# Patient Record
Sex: Female | Born: 1965 | Race: White | Hispanic: No | Marital: Married | Smoking: Never smoker
Health system: Southern US, Community
[De-identification: ages and names within clinical notes are randomized; demographics above are authoritative.]

## PROBLEM LIST (undated history)

## (undated) DIAGNOSIS — R110 Nausea: Secondary | ICD-10-CM

## (undated) DIAGNOSIS — N200 Calculus of kidney: Secondary | ICD-10-CM

## (undated) DIAGNOSIS — R112 Nausea with vomiting, unspecified: Secondary | ICD-10-CM

## (undated) DIAGNOSIS — I10 Essential (primary) hypertension: Secondary | ICD-10-CM

## (undated) DIAGNOSIS — Z9889 Other specified postprocedural states: Secondary | ICD-10-CM

## (undated) DIAGNOSIS — Z789 Other specified health status: Secondary | ICD-10-CM

---

## 1997-05-31 ENCOUNTER — Inpatient Hospital Stay (HOSPITAL_COMMUNITY): Admission: AD | Admit: 1997-05-31 | Discharge: 1997-06-02 | Payer: Self-pay | Admitting: Obstetrics & Gynecology

## 1997-07-06 ENCOUNTER — Ambulatory Visit (HOSPITAL_COMMUNITY): Admission: RE | Admit: 1997-07-06 | Discharge: 1997-07-06 | Payer: Self-pay | Admitting: Obstetrics and Gynecology

## 1997-12-12 ENCOUNTER — Other Ambulatory Visit: Admission: RE | Admit: 1997-12-12 | Discharge: 1997-12-12 | Payer: Self-pay | Admitting: Obstetrics and Gynecology

## 1998-12-27 ENCOUNTER — Other Ambulatory Visit: Admission: RE | Admit: 1998-12-27 | Discharge: 1998-12-27 | Payer: Self-pay | Admitting: Obstetrics and Gynecology

## 2001-07-28 ENCOUNTER — Other Ambulatory Visit: Admission: RE | Admit: 2001-07-28 | Discharge: 2001-07-28 | Payer: Self-pay | Admitting: Obstetrics and Gynecology

## 2003-06-08 ENCOUNTER — Other Ambulatory Visit: Admission: RE | Admit: 2003-06-08 | Discharge: 2003-06-08 | Payer: Self-pay | Admitting: Obstetrics and Gynecology

## 2004-11-27 ENCOUNTER — Other Ambulatory Visit: Admission: RE | Admit: 2004-11-27 | Discharge: 2004-11-27 | Payer: Self-pay | Admitting: Obstetrics and Gynecology

## 2005-01-16 ENCOUNTER — Encounter (INDEPENDENT_AMBULATORY_CARE_PROVIDER_SITE_OTHER): Payer: Self-pay | Admitting: *Deleted

## 2005-01-16 ENCOUNTER — Ambulatory Visit (HOSPITAL_COMMUNITY): Admission: RE | Admit: 2005-01-16 | Discharge: 2005-01-16 | Payer: Self-pay | Admitting: Obstetrics and Gynecology

## 2007-04-28 HISTORY — PX: ABDOMINAL HYSTERECTOMY: SHX81

## 2007-05-04 ENCOUNTER — Ambulatory Visit (HOSPITAL_COMMUNITY): Admission: RE | Admit: 2007-05-04 | Discharge: 2007-05-06 | Payer: Self-pay | Admitting: Obstetrics and Gynecology

## 2007-05-04 ENCOUNTER — Encounter (INDEPENDENT_AMBULATORY_CARE_PROVIDER_SITE_OTHER): Payer: Self-pay | Admitting: Obstetrics and Gynecology

## 2007-08-19 ENCOUNTER — Emergency Department (HOSPITAL_COMMUNITY): Admission: EM | Admit: 2007-08-19 | Discharge: 2007-08-19 | Payer: Self-pay | Admitting: Emergency Medicine

## 2010-09-09 NOTE — Op Note (Signed)
Sherri Hull, Sherri Hull NO.:  [id number]   MEDICAL RECORD NO.:  [id number]          PATIENT TYPE:  AMB   LOCATION:  SDC                           FACILITY:  [facility]   PHYSICIAN:  Miguel Aschoff, M.D.       DATE OF BIRTH:  [birth year]   DATE OF PROCEDURE:  05/04/2007  DATE OF DISCHARGE:                               OPERATIVE REPORT   PREOPERATIVE DIAGNOSES:  1. Chronic pelvic pain.  2. Pelvic endometriosis.   POSTOPERATIVE DIAGNOSES:  1. Chronic pelvic pain.  2. Pelvic endometriosis.   PROCEDURE:  Laparoscopically-assisted total vaginal hysterectomy and  bilateral salpingo-oophorectomy.   SURGEON:  Dr. Miguel Aschoff.   ANESTHESIA:  General.   COMPLICATIONS:  None.   JUSTIFICATION FOR PROCEDURE:  The patient is a 45 year old white female  with history of persistent pelvic pain.  The patient underwent previous  laparoscopy in September 2006, revealing pelvic endometriosis and small  urinary fibroids.  The endometriosis was treated but her pain has  persisted and this is just associated with heavy menstrual flow.  She  therefore presents now to undergo definitive procedure via  laparoscopically-assisted hysterectomy and bilateral salpingo-  oophorectomy.  The patient understands risks and benefits of the  procedure and understands that this should result in her requiring  hormone replacement therapy since the ovaries are to be removed.  Informed consent has been obtained.   PROCEDURE:  The patient was taken to the operating room, placed in the  supine position.  General anesthesia was administered without  difficulty.  She was then placed in the dorsal lithotomy position,  prepped and draped in the usual sterile fashion.  Foley catheter was  inserted.  At this point, a Hulka tenaculum was placed through the  cervix and held.  Attention was then directed to the umbilicus where a  small infraumbilical incision was made.  A Veress needle was then  inserted and then the  abdomen was insufflated with 3 liters of CO2.  Following this, trocar for laparoscope was placed, followed by  laparoscope itself and under direct visualization two 5 mm ports were  established in the right and left lower quadrants.  After this was done,  systematic inspection was carried out.  This which revealed the uterus  be anterior, globular, top normal in size.  Anterior bladder peritoneum  was unremarkable.  The round ligaments were unremarkable.  The ovaries  were inspected.  The ovaries were noted be within normal limits.  The  right tube was noted be absent due for prior surgical removal.  The left  tube was normal, as was the left ovary.  Cul-de-sac had small implants  of endometriosis noted.  The intestinal surfaces were unremarkable.  No  other abnormalities were noted in abdomen.  At this point, the Gyrus  unit was placed through the right lower quadrant port and the  infundibulopelvic ligament was identified on the right side, cauterized  and cut and then dissection continued along these mesovarian ligament  until the cornual portion of the uterus was reached.  Then the round  ligament  was identified, grasped with the Gyrus unit, cauterized and cut  and then serial bites were taken with the Gyrus unit along the broad  ligament structures until the uterine vessels were reached.  This was  all done using series of cauterizations and cuts.  Once the right side  was completed, the identical procedure was carried out on the left side,  again with care to avoid any injury to the ureters.  The infundibular  pelvic ligament was grasped, cauterized and cut and again dissection  carried medially until cornual portion of the uterus was reached by  cauterizing the mesovarian ligament, mesosalpinx and round ligament and  then serial bites were taken along the broad ligament until the uterine  vessels were reached.  At this point, attention was directed to the  vaginal portion of the  procedure.  The patient was placed in dorsal  lithotomy position.  Weighted speculum was placed in the vaginal vault.  The anterior cervical lip was grasped with a tenaculum and then injected  with 10 mL of 1% Xylocaine with epinephrine.  Then the cervical mucosa  was circumscribed and dissected anteriorly and posteriorly until the  peritoneal reflections were found.  Peritoneum was then entered.  At  this point, the uterosacral ligaments were clamped with curved Heaney  clamps.  These pedicles were cut and suture ligated using suture  ligatures of 0 Vicryl.  These pedicles were held.  Then the cardinal  ligaments were clamped, cut and suture ligated in similar fashion.  Additional bites were then taken of the paracervical fascia on the left  and right sides, again using curved Heaney clamps.  Again, all pedicles  were cut and suture ligated using suture ligatures 0 Vicryl.  Uterine  vessels were then clamped, cut and suture ligated using suture ligatures  of 0 Vicryl.  Once this was done, the uterine fundus was delivered  through the cul-de-sac and residual structures of the broad ligament  were clamped with Heaney clamps, cut and this freed the specimen.  The  specimen included cervix, uterus, tubes and ovaries.  Residual pedicles  were then ligated using ligatures of 0 Vicryl.  Then the posterior  vaginal cuff was made hemostatic by running interlocking 0 Vicryl  suture.  Then the cuff was closed using running interlocking 0 Vicryl  suture.  In doing this, uterosacral ligaments were approximated in the  midline for support of the apex of the cuff.  An Iodoform pack was  placed.  At this point, the patient was taken out of the lithotomy  position.  Attention was directed back to the abdominal portion of the  procedure.  The abdomen was reinsufflated.  Inspection was carried out  for hemostasis.  All pedicles appeared be hemostatically secure.  Pelvis  was then irrigated with saline via  the [place] suction irrigation unit  and with good hemostasis and all instruments and lap counts found to be  correct, the procedure was completed.  The CO2 was allowed to escape.  All instruments were removed.  Small abdominal incisions were closed  using subcuticular 4-0 Vicryl.  The estimated blood loss was  approximately 300 mL.  The patient tolerated the procedure well and went  to the recovery room in satisfactory condition.      Miguel Aschoff, M.D.  Electronically Signed     AR/MEDQ  D:  05/04/2007  T:  05/04/2007  Job:  [id number]

## 2010-09-12 NOTE — Op Note (Signed)
NAMEHANI, PATNODE NO.:  [id number]   MEDICAL RECORD NO.:  [id number]          PATIENT TYPE:  AMB   LOCATION:  SDC                           FACILITY:  [facility]   PHYSICIAN:  Miguel Aschoff, M.D.       DATE OF BIRTH:  [birth year]   DATE OF PROCEDURE:  01/16/2005  DATE OF DISCHARGE:                                 OPERATIVE REPORT   PREOPERATIVE DIAGNOSIS:  Persistent right lower quadrant pain.   POSTOPERATIVE DIAGNOSES:  1.  Peritoneal endometriosis.  2.  Possible intermittent torsion of the distal right tubal segment.   PROCEDURE:  Diagnostic laparoscopy, cautery of the peritoneal endometriosis,  removal of distal right fallopian tube.   SURGEON:  Dr. Miguel Aschoff   ANESTHESIA:  General.   COMPLICATIONS:  None.   JUSTIFICATION:  The patient is a 45 year old white female with a history of  persistent right lower quadrant pain that had not responded to outpatient  therapy.  Prior ultrasound examination was unrevealing and due to the  persistence of her pain, she presents now to undergo laparoscopy to see if  an etiology can be established and corrected. Risks, benefits of procedure  were discussed with the patient.   PROCEDURE:  The patient was taken to the operating, placed in the supine  position. General anesthesia was administered without difficulty. She was  then placed in the dorsolithotomy position, prepped and draped in the usual  sterile fashion. Bladder was catheterized.  A Hulka tenaculum was placed  through the cervix and held. Once this was done, attention was directed to  the umbilicus where a small infraumbilical incision was made. A Veress  needle was inserted and the abdomen was insufflated with 3 liters CO2.  Following this the trocar to laparoscope was placed followed by laparoscope  itself. Then to allow complete visualization a 5-mm suprapubic port was  established under direct visualization. At this point systematic inspection  was carried  out. The anterior bladder peritoneum revealed characteristic  power burn lesions of endometriosis in two areas. The uterus appeared to be  normal size and shape and was unremarkable. The right fallopian tube  revealed prior history of the tubal sterilization with large segment of mid  portion of tube missing. There was what appeared to be small hydrosalpinx  present at the proximal portion of the right remaining tubal segment. The  distal tubal segment appeared to be normal, but on a small stalk and it was  felt that this also may be possibly having intermittent torsion. The ovary  appeared to be normal. The left tube again was normal except for the missing  tubal segment. The left ovary was within normal limits. Inspection of the  uterosacral ligaments was unremarkable. There was no definite evidence of  endometriosis of cul-de-sac. Round ligaments were normal. There was no  evidence of any inguinal hernias. Inspection of the intestinal surfaces was  unremarkable. Inspection of peritoneal surfaces revealed two areas of  endometrial implants on the right anterior peritoneum and lower abdomen. At  this point the bipolar cautery forceps were introduced  and all the implants  of endometriosis on the peritoneal surfaces were identified and cauterized  without difficulty destroying the implants. Care was taken to avoid any  injury to adjacent structures. Then due to the possibility of the patient's  residual pain being secondary to intermittent torsion of the distal right  tube, the distal tube was cauterized along the mesosalpinx and this segment  was cut free and removed through the midline lower abdominal port which had  been enlarged to 11 mm. The proximal segment of tube where there appeared to  be a small hydrosalpinx was cauterized and the hydrosalpinx was eliminated.  At this point with no other abnormalities being noted, the procedure was  completed. The CO2 was allowed to escape. All  instruments were removed. The  lower incision was closed using a 2-0 Vicryl suture and then subcuticular 4-  0 Vicryl suture. Infraumbilical incision was closed using subcuticular 4-0  Vicryl suture. The port sites were injected with 0.25% Marcaine. 10 mL were  used.   Plan is for the patient to return to the office in 4 weeks for follow-up  examination. She is to call for any problems such as fever, pain or heavy  bleeding. She was instructed to do no heavy lifting and to place nothing in  the vagina for approximately 2 weeks.   MEDICATIONS FOR HOME:  1.  Tylox one every 3 hours as needed for pain.  2.  Doxycycline 100 milligrams twice a day x3 days.      Miguel Aschoff, M.D.  Electronically Signed     AR/MEDQ  D:  01/16/2005  T:  01/16/2005  Job:  [id number]

## 2010-09-12 NOTE — Discharge Summary (Signed)
Sherri Hull, Sherri Hull NO.:  [id number]   MEDICAL RECORD NO.:  [id number]          PATIENT TYPE:  OIB   LOCATION:  9304                          FACILITY:  [facility]   PHYSICIAN:  Miguel Aschoff, M.D.       DATE OF BIRTH:  15-May-1965   DATE OF ADMISSION:  05/04/2007  DATE OF DISCHARGE:  05/06/2007                               DISCHARGE SUMMARY   ADMISSION DIAGNOSIS:  Chronic pelvic pain and endometriosis.   FINAL DIAGNOSIS:  Chronic pelvic pain and endometriosis.   OPERATIONS AND PROCEDURES:  Laparoscopically-assisted total vaginal  hysterectomy and bilateral salpingo-oophorectomy.   ANESTHESIA:  General.   BRIEF HISTORY:  The patient is a 45 year old white female with chronic  pelvic pain previously diagnosed with pelvic endometriosis and small  uterine fibroids.  The patient treated conservatively.  However, her  symptomatology has increased with worsening pain.  She presents now to  undergo definitive therapy with laparoscopic hysterectomy and bilateral  salpingo-oophorectomy.   HOSPITAL COURSE:  The patient was admitted and laboratory studies were  obtained.  She had admission hemoglobin of 11.5, white count of 5100.  PT/PTT within normal limits.  Chemistry profile revealed a slightly low  potassium at 3.4.  Glucose was 22.  The remainder of values were normal.  Urinalysis was negative.   Under general anesthesia on May 04, 2007 laparoscopically-assisted  total vaginal hysterectomy and bilateral salpingo-oophorectomy were  carried out without difficulty.  The patient tolerated the surgical  procedure well.  She had an uneventful postoperative course and  tolerated increasing ambulation and diet well.  Her hemoglobin did drop  to 9.1.  She was observed until May 06, 2007 and was in satisfactory  condition and was to be discharged home.  Medications for home included  Tylox 1 every 3 hours for pain, Slow Fe 1 twice a day, estradiol 1 mg  daily.  She is  instructed to place nothing in vagina and to call for any  problems such as fever, pain or heavy bleeding.  She is to set up follow-  up visit for 4 weeks.  The pathology specimen revealed proliferative  endometrium, benign leiomyomas, chronic cervicitis with the ovarian  specimen showing a paratubal cyst, ovarian fluid cysts, and corpus  luteum cyst and pathology was noted.  on weakness [place] day.  Dr. Merrily Brittle, M.D.  Electronically Signed     AR/MEDQ  D:  05/19/2007  T:  05/19/2007  Job:  [id number]

## 2010-10-02 ENCOUNTER — Encounter: Payer: Self-pay | Admitting: Internal Medicine

## 2010-11-13 ENCOUNTER — Ambulatory Visit: Payer: Self-pay | Admitting: Internal Medicine

## 2010-12-01 ENCOUNTER — Other Ambulatory Visit: Payer: Self-pay | Admitting: Gastroenterology

## 2011-01-15 LAB — URINALYSIS, ROUTINE W REFLEX MICROSCOPIC
Bilirubin Urine: NEGATIVE
Nitrite: NEGATIVE
Protein, ur: NEGATIVE
Specific Gravity, Urine: 1.01
Urobilinogen, UA: 0.2

## 2011-01-15 LAB — COMPREHENSIVE METABOLIC PANEL WITH GFR
ALT: 18
AST: 19
Albumin: 4.1
Alkaline Phosphatase: 64
BUN: 8
CO2: 28
Calcium: 9.1
Chloride: 105
Creatinine, Ser: 0.77
GFR calc non Af Amer: >60
Glucose, Bld: 118 — ABNORMAL HIGH
Potassium: 3.4 — ABNORMAL LOW
Sodium: 140
Total Bilirubin: 0.6
Total Protein: 7.4

## 2011-01-15 LAB — HEMOGLOBIN AND HEMATOCRIT, BLOOD: HCT: 30.4 — ABNORMAL LOW

## 2011-01-15 LAB — CBC
HCT: 26.2 — ABNORMAL LOW
HCT: 33.6 — ABNORMAL LOW
MCHC: 34.1
MCHC: 34.6
MCV: 84.1
MCV: 84.8
Platelets: 184
Platelets: 235
RDW: 13.8

## 2011-01-15 LAB — PROTIME-INR
INR: 1
Prothrombin Time: 13.3

## 2011-01-15 LAB — PREGNANCY, URINE: Preg Test, Ur: NEGATIVE

## 2011-01-15 LAB — DIFFERENTIAL
Basophils Absolute: 0
Basophils Relative: 1
Eosinophils Relative: 2
Lymphocytes Relative: 38
Neutro Abs: 2.7

## 2011-07-06 ENCOUNTER — Other Ambulatory Visit: Payer: Self-pay | Admitting: Gastroenterology

## 2011-07-06 DIAGNOSIS — R1011 Right upper quadrant pain: Secondary | ICD-10-CM

## 2011-07-09 ENCOUNTER — Ambulatory Visit
Admission: RE | Admit: 2011-07-09 | Discharge: 2011-07-09 | Disposition: A | Discharge status: Home / Self Care | Payer: 59 | Source: Ambulatory Visit | Attending: Gastroenterology | Admitting: Gastroenterology

## 2011-07-09 ENCOUNTER — Other Ambulatory Visit (HOSPITAL_COMMUNITY): Payer: Self-pay | Admitting: Gastroenterology

## 2011-07-09 DIAGNOSIS — R1011 Right upper quadrant pain: Secondary | ICD-10-CM

## 2011-07-20 ENCOUNTER — Encounter (HOSPITAL_COMMUNITY)
Admission: RE | Admit: 2011-07-20 | Discharge: 2011-07-20 | Disposition: A | Discharge status: Home / Self Care | Payer: 59 | Source: Ambulatory Visit | Attending: Gastroenterology | Admitting: Gastroenterology

## 2011-07-20 DIAGNOSIS — R1011 Right upper quadrant pain: Secondary | ICD-10-CM | POA: Insufficient documentation

## 2011-07-20 DIAGNOSIS — R948 Abnormal results of function studies of other organs and systems: Secondary | ICD-10-CM | POA: Insufficient documentation

## 2011-07-20 MED ORDER — TECHNETIUM TC 99M MEBROFENIN IV KIT
5.0000 | PACK | Freq: Once | INTRAVENOUS | Status: AC | PRN
  Administered 2011-07-20: 5 via INTRAVENOUS

## 2011-07-20 MED ORDER — SINCALIDE 5 MCG IJ SOLR
INTRAMUSCULAR | Status: AC
  Administered 2011-07-20: 5 ug via INTRAVENOUS
  Filled 2011-07-20: qty 5

## 2011-07-23 ENCOUNTER — Encounter (INDEPENDENT_AMBULATORY_CARE_PROVIDER_SITE_OTHER): Payer: Self-pay | Admitting: General Surgery

## 2011-07-23 ENCOUNTER — Ambulatory Visit (INDEPENDENT_AMBULATORY_CARE_PROVIDER_SITE_OTHER): Payer: 59 | Admitting: General Surgery

## 2011-07-23 VITALS — BP 134/86 | HR 60 | Temp 97.7°F | Ht 67.0 in | Wt 241.0 lb

## 2011-07-23 DIAGNOSIS — K828 Other specified diseases of gallbladder: Secondary | ICD-10-CM

## 2011-07-23 NOTE — Progress Notes (Signed)
Patient ID: Sherri Hull, female   DOB: 10-04-1965, 46 y.o.   MRN: [id number]  Chief Complaint  Patient presents with  . Abdominal Pain    HPI Sherri Hull is a 46 y.o. female.  Referred by Dr. Evette Cristal for eval of RUQ abdominal pain after eating.  Pain radiates to her back and has associated nausea but only rare/occasional vomiting.  She has had the pain for several months but says that it has been more frequent and her last episode was last Saturday after eating spaghetti.  She says that the pain usually comes first and leads to the nausea.  She also has some diarrhea with fatty foods but denies any other symptoms such as fevers or chills.  She had an [place] which was negative for cholelithiasis and this was followed by a [place] which showed a 12% EF and she was referred for surgical consultation. LFT's were normal. HPI  No past medical history on file.  Past Surgical History  Procedure Date  . Abdominal hysterectomy 2009    No family history on file.  Social History History  Substance Use Topics  . Smoking status: Never Smoker   . Smokeless tobacco: Not on file  . Alcohol Use: No    No Known Allergies  No current outpatient prescriptions on file.    Review of Systems Review of Systems All other review of systems negative or noncontributory except as stated in the HPI  Blood pressure 134/86, pulse 60, temperature 97.7 F (36.5 C), height 5\' 7"  (1.702 m), weight 241 lb (109.317 kg).  Physical Exam Physical Exam Physical Exam  Nursing note and vitals reviewed. Constitutional: She is oriented to person, place, and time. She appears well-developed and well-nourished. No distress.  HENT:  Head: Normocephalic and atraumatic.  Mouth/Throat: No oropharyngeal exudate.  Eyes: Conjunctivae and EOM are normal. Pupils are equal, round, and reactive to light. Right eye exhibits no discharge. Left eye exhibits no discharge. No scleral icterus.  Neck: Normal range of motion. Neck supple. No  tracheal deviation present.  Cardiovascular: Normal rate, regular rhythm, normal heart sounds and intact distal pulses.   Pulmonary/Chest: Effort normal and breath sounds normal. No stridor. No respiratory distress. She has no wheezes.  Abdominal: Soft. Bowel sounds are normal. She exhibits no distension and no mass. There is no tenderness. There is no rebound and no guarding.  Musculoskeletal: Normal range of motion. She exhibits no edema and no tenderness.  Neurological: She is alert and oriented to person, place, and time.  Skin: Skin is warm and dry. No rash noted. She is not diaphoretic. No erythema. No pallor.  Psychiatric: She has a normal mood and affect. Her behavior is normal. Judgment and thought content normal.    Data Reviewed [place], HIDA, Labs  Assessment    Biliary dyskinesia She has symptoms which do sound biliary in nature and with a HIDA of 12% I think that it is reasonable to offer cholecystectomy.  I did explain that her biggest risks would be persistent symptoms/failure of relief and diarrhea.  We also discussed the other risks of the procedure including infection, bleeding, pain, scarring, need for open, injury to bowel or bile ducts, and diarrhea since she is already having symptoms of this. She expressed understanding and desires to proceed with lap cholecystectomy.    Plan    Will schedule as soon as available.       Lodema Pilot DAVID 07/23/2011, 8:08 PM

## 2011-08-06 ENCOUNTER — Encounter (HOSPITAL_COMMUNITY): Payer: Self-pay | Admitting: Pharmacy Technician

## 2011-08-13 ENCOUNTER — Encounter (HOSPITAL_COMMUNITY)
Admission: RE | Admit: 2011-08-13 | Discharge: 2011-08-13 | Disposition: A | Discharge status: Home / Self Care | Payer: 59 | Source: Ambulatory Visit | Attending: General Surgery | Admitting: General Surgery

## 2011-08-13 ENCOUNTER — Encounter (HOSPITAL_COMMUNITY): Payer: Self-pay

## 2011-08-13 VITALS — BP 124/77 | HR 64 | Temp 97.0°F | Resp 20 | Ht 67.0 in | Wt 241.4 lb

## 2011-08-13 DIAGNOSIS — K828 Other specified diseases of gallbladder: Secondary | ICD-10-CM

## 2011-08-13 HISTORY — DX: Other specified health status: Z78.9

## 2011-08-13 HISTORY — DX: Other specified postprocedural states: Z98.890

## 2011-08-13 HISTORY — DX: Nausea with vomiting, unspecified: R11.2

## 2011-08-13 HISTORY — DX: Nausea: R11.0

## 2011-08-13 LAB — CBC
MCH: 29.3 pg (ref 26.0–34.0)
MCHC: 33.7 g/dL (ref 30.0–36.0)
MCV: 86.9 fL (ref 78.0–100.0)
Platelets: 191 10*3/uL (ref 150–400)
RDW: 12.5 % (ref 11.5–15.5)
WBC: 6.1 10*3/uL (ref 4.0–10.5)

## 2011-08-13 LAB — DIFFERENTIAL
Basophils Absolute: 0 10*3/uL (ref 0.0–0.1)
Lymphocytes Relative: 47 % — ABNORMAL HIGH (ref 12–46)
Monocytes Absolute: 0.5 10*3/uL (ref 0.1–1.0)
Monocytes Relative: 8 % (ref 3–12)
Neutro Abs: 2.6 10*3/uL (ref 1.7–7.7)
Neutrophils Relative %: 43 % (ref 43–77)

## 2011-08-13 LAB — COMPREHENSIVE METABOLIC PANEL
AST: 20 U/L (ref 0–37)
Albumin: 4.1 g/dL (ref 3.5–5.2)
BUN: 18 mg/dL (ref 6–23)
Calcium: 9.3 mg/dL (ref 8.4–10.5)
Creatinine, Ser: 0.76 mg/dL (ref 0.50–1.10)

## 2011-08-13 NOTE — Pre-Procedure Instructions (Signed)
20 Sherri Hull  08/13/2011   Your procedure is scheduled on:  Thursday, April 25th   Report to [facility] at 10:00 AM.   Call this number if you have problems the morning of surgery: [phone]   Remember:   Do not eat food:After Midnight Wednesday .  May have clear liquids: up to 4 Hours before arrival time...6 AM  Clear liquids include soda, tea, black coffee, apple or grape juice, broth .  Take these medicines the morning of surgery with A SIP OF WATER: none   Do not wear jewelry, make-up or nail polish.  Do not wear lotions, powders, or perfumes. You may wear deodorant.  Do not shave 48 hours prior to surgery.  Do not bring valuables to the hospital.  Contacts, dentures or bridgework may not be worn into surgery.  Leave suitcase in the car. After surgery it may be brought to your room.  For patients admitted to the hospital, checkout time is 11:00 AM the day of discharge.   Patients discharged the day of surgery will not be allowed to drive home.  Name and phone number of your driver: family   Special Instructions: CHG Shower Use Special Wash: 1/2 bottle night before surgery and 1/2 bottle morning of surgery.   Please read over the following fact sheets that you were given: Pain Booklet, Coughing and Deep Breathing, MRSA Information and Surgical Site Infection Prevention

## 2011-08-19 MED ORDER — CEFAZOLIN SODIUM-DEXTROSE 2-3 GM-% IV SOLR
2.0000 g | INTRAVENOUS | Status: AC
  Administered 2011-08-20: 2 g via INTRAVENOUS
  Filled 2011-08-19: qty 50

## 2011-08-20 ENCOUNTER — Encounter (HOSPITAL_COMMUNITY): Payer: Self-pay | Admitting: Anesthesiology

## 2011-08-20 ENCOUNTER — Encounter (HOSPITAL_COMMUNITY)
Admission: RE | Disposition: A | Discharge status: Home / Self Care | Payer: Self-pay | Source: Ambulatory Visit | Attending: General Surgery

## 2011-08-20 ENCOUNTER — Ambulatory Visit (HOSPITAL_COMMUNITY): Payer: 59

## 2011-08-20 ENCOUNTER — Ambulatory Visit (HOSPITAL_COMMUNITY): Payer: 59 | Admitting: Anesthesiology

## 2011-08-20 ENCOUNTER — Encounter (HOSPITAL_COMMUNITY): Payer: Self-pay | Admitting: *Deleted

## 2011-08-20 ENCOUNTER — Ambulatory Visit (HOSPITAL_COMMUNITY)
Admission: RE | Admit: 2011-08-20 | Discharge: 2011-08-20 | Disposition: A | Discharge status: Home / Self Care | Payer: 59 | Source: Ambulatory Visit | Attending: General Surgery | Admitting: General Surgery

## 2011-08-20 DIAGNOSIS — Z01812 Encounter for preprocedural laboratory examination: Secondary | ICD-10-CM | POA: Insufficient documentation

## 2011-08-20 DIAGNOSIS — K219 Gastro-esophageal reflux disease without esophagitis: Secondary | ICD-10-CM | POA: Insufficient documentation

## 2011-08-20 DIAGNOSIS — K811 Chronic cholecystitis: Secondary | ICD-10-CM

## 2011-08-20 DIAGNOSIS — K828 Other specified diseases of gallbladder: Secondary | ICD-10-CM | POA: Insufficient documentation

## 2011-08-20 HISTORY — PX: CHOLECYSTECTOMY: SHX55

## 2011-08-20 SURGERY — LAPAROSCOPIC CHOLECYSTECTOMY WITH INTRAOPERATIVE CHOLANGIOGRAM
Anesthesia: General | Site: Abdomen | Wound class: Clean Contaminated

## 2011-08-20 MED ORDER — LIDOCAINE-EPINEPHRINE (PF) 1 %-1:200000 IJ SOLN
INTRAMUSCULAR | Status: DC | PRN
  Administered 2011-08-20: 30 mL

## 2011-08-20 MED ORDER — ONDANSETRON HCL 4 MG/2ML IJ SOLN
INTRAMUSCULAR | Status: DC | PRN
  Administered 2011-08-20 (×2): 4 mg via INTRAVENOUS

## 2011-08-20 MED ORDER — MIDAZOLAM HCL 5 MG/5ML IJ SOLN
INTRAMUSCULAR | Status: DC | PRN
  Administered 2011-08-20: 2 mg via INTRAVENOUS

## 2011-08-20 MED ORDER — HYDROMORPHONE HCL PF 1 MG/ML IJ SOLN
0.2500 mg | INTRAMUSCULAR | Status: DC | PRN
  Administered 2011-08-20 (×2): 0.5 mg via INTRAVENOUS

## 2011-08-20 MED ORDER — FENTANYL CITRATE 0.05 MG/ML IJ SOLN
INTRAMUSCULAR | Status: DC | PRN
  Administered 2011-08-20: 150 ug via INTRAVENOUS
  Administered 2011-08-20 (×2): 50 ug via INTRAVENOUS

## 2011-08-20 MED ORDER — KETOROLAC TROMETHAMINE 30 MG/ML IJ SOLN
30.0000 mg | Freq: Once | INTRAMUSCULAR | Status: AC
  Administered 2011-08-20: 30 mg via INTRAVENOUS

## 2011-08-20 MED ORDER — NEOSTIGMINE METHYLSULFATE 1 MG/ML IJ SOLN
INTRAMUSCULAR | Status: DC | PRN
  Administered 2011-08-20: 4 mg via INTRAVENOUS

## 2011-08-20 MED ORDER — SODIUM CHLORIDE 0.9 % IR SOLN
Status: DC | PRN
  Administered 2011-08-20: 1000 mL

## 2011-08-20 MED ORDER — ACETAMINOPHEN 10 MG/ML IV SOLN
1000.0000 mg | Freq: Once | INTRAVENOUS | Status: AC
Start: 2011-08-20 — End: 2011-08-20
  Administered 2011-08-20: 1000 mg via INTRAVENOUS
  Filled 2011-08-20: qty 100

## 2011-08-20 MED ORDER — GLYCOPYRROLATE 0.2 MG/ML IJ SOLN
INTRAMUSCULAR | Status: DC | PRN
  Administered 2011-08-20: 0.6 mg via INTRAVENOUS

## 2011-08-20 MED ORDER — KETOROLAC TROMETHAMINE 30 MG/ML IJ SOLN
INTRAMUSCULAR | Status: AC
  Filled 2011-08-20: qty 1

## 2011-08-20 MED ORDER — LACTATED RINGERS IV SOLN
INTRAVENOUS | Status: DC | PRN
  Administered 2011-08-20 (×2): via INTRAVENOUS

## 2011-08-20 MED ORDER — LIDOCAINE HCL (CARDIAC) 20 MG/ML IV SOLN
INTRAVENOUS | Status: DC | PRN
  Administered 2011-08-20: 100 mg via INTRAVENOUS

## 2011-08-20 MED ORDER — PROMETHAZINE HCL 25 MG/ML IJ SOLN
INTRAMUSCULAR | Status: AC
  Filled 2011-08-20: qty 1

## 2011-08-20 MED ORDER — ROCURONIUM BROMIDE 100 MG/10ML IV SOLN
INTRAVENOUS | Status: DC | PRN
  Administered 2011-08-20: 10 mg via INTRAVENOUS
  Administered 2011-08-20: 40 mg via INTRAVENOUS

## 2011-08-20 MED ORDER — BUPIVACAINE HCL (PF) 0.25 % IJ SOLN
INTRAMUSCULAR | Status: DC | PRN
  Administered 2011-08-20: 30 mL

## 2011-08-20 MED ORDER — 0.9 % SODIUM CHLORIDE (POUR BTL) OPTIME
TOPICAL | Status: DC | PRN
  Administered 2011-08-20: 1000 mL

## 2011-08-20 MED ORDER — PROPOFOL 10 MG/ML IV EMUL
INTRAVENOUS | Status: DC | PRN
  Administered 2011-08-20: 200 mg via INTRAVENOUS

## 2011-08-20 MED ORDER — PROMETHAZINE HCL 25 MG/ML IJ SOLN
6.2500 mg | INTRAMUSCULAR | Status: DC | PRN
  Administered 2011-08-20: 6.25 mg via INTRAVENOUS

## 2011-08-20 MED ORDER — SCOPOLAMINE 1 MG/3DAYS TD PT72: 1.0000 | MEDICATED_PATCH | TRANSDERMAL | Status: DC

## 2011-08-20 MED ORDER — IOHEXOL 300 MG/ML  SOLN
INTRAMUSCULAR | Status: DC | PRN
  Administered 2011-08-20: 12:00:00

## 2011-08-20 MED ORDER — HYDROCODONE-ACETAMINOPHEN 5-325 MG PO TABS: 1.0000 | ORAL_TABLET | ORAL | Status: AC | PRN

## 2011-08-20 MED ORDER — DEXAMETHASONE SODIUM PHOSPHATE 4 MG/ML IJ SOLN
INTRAMUSCULAR | Status: DC | PRN
  Administered 2011-08-20: 8 mg via INTRAVENOUS

## 2011-08-20 MED ORDER — SCOPOLAMINE 1 MG/3DAYS TD PT72
MEDICATED_PATCH | TRANSDERMAL | Status: DC | PRN
  Administered 2011-08-20: 1 via TRANSDERMAL

## 2011-08-20 SURGICAL SUPPLY — 54 items
ADH SKN CLS APL DERMABOND .7 (GAUZE/BANDAGES/DRESSINGS) ×1
ADH SKN CLS LQ APL DERMABOND (GAUZE/BANDAGES/DRESSINGS) ×1
APPLIER CLIP ROT 10 11.4 M/L (STAPLE) ×2
APR CLP MED LRG 11.4X10 (STAPLE) ×1
BAG SPEC RTRVL LRG 6X4 10 (ENDOMECHANICALS) ×1
BLADE SURG ROTATE 9660 (MISCELLANEOUS) IMPLANT
CANISTER SUCTION 2500CC (MISCELLANEOUS) ×2 IMPLANT
CATH REDDICK CHOLANGI 4FR 50CM (CATHETERS) ×1 IMPLANT
CHLORAPREP W/TINT 26ML (MISCELLANEOUS) ×2 IMPLANT
CLIP APPLIE ROT 10 11.4 M/L (STAPLE) ×1 IMPLANT
CLOTH BEACON ORANGE TIMEOUT ST (SAFETY) ×2 IMPLANT
COVER SURGICAL LIGHT HANDLE (MISCELLANEOUS) ×2 IMPLANT
DECANTER SPIKE VIAL GLASS SM (MISCELLANEOUS) ×2 IMPLANT
DERMABOND ADHESIVE PROPEN (GAUZE/BANDAGES/DRESSINGS) ×1
DERMABOND ADVANCED (GAUZE/BANDAGES/DRESSINGS) ×1
DERMABOND ADVANCED .7 DNX12 (GAUZE/BANDAGES/DRESSINGS) ×1 IMPLANT
DERMABOND ADVANCED .7 DNX6 (GAUZE/BANDAGES/DRESSINGS) IMPLANT
DRAPE C-ARM 42X72 X-RAY (DRAPES) ×2 IMPLANT
ELECT CAUTERY BLADE 6.4 (BLADE) ×2 IMPLANT
ELECT REM PT RETURN 9FT ADLT (ELECTROSURGICAL) ×2
ELECTRODE REM PT RTRN 9FT ADLT (ELECTROSURGICAL) ×1 IMPLANT
ENDOLOOP SUT PDS II  0 18 (SUTURE) ×1
ENDOLOOP SUT PDS II 0 18 (SUTURE) IMPLANT
GLOVE BIO SURGEON STRL SZ7.5 (GLOVE) ×1 IMPLANT
GLOVE BIOGEL PI IND STRL 7.0 (GLOVE) IMPLANT
GLOVE BIOGEL PI IND STRL 7.5 (GLOVE) IMPLANT
GLOVE BIOGEL PI INDICATOR 7.0 (GLOVE) ×1
GLOVE BIOGEL PI INDICATOR 7.5 (GLOVE) ×1
GLOVE ECLIPSE 6.5 STRL STRAW (GLOVE) ×1 IMPLANT
GLOVE EXAM NITRILE MD LF STRL (GLOVE) ×1 IMPLANT
GLOVE SURG SS PI 7.5 STRL IVOR (GLOVE) ×4 IMPLANT
GOWN PREVENTION PLUS XLARGE (GOWN DISPOSABLE) ×2 IMPLANT
GOWN STRL NON-REIN LRG LVL3 (GOWN DISPOSABLE) ×6 IMPLANT
IV CATH 14GX2 1/4 (CATHETERS) ×2 IMPLANT
KIT BASIN OR (CUSTOM PROCEDURE TRAY) ×2 IMPLANT
KIT ROOM TURNOVER OR (KITS) ×2 IMPLANT
NS IRRIG 1000ML POUR BTL (IV SOLUTION) ×2 IMPLANT
PAD ARMBOARD 7.5X6 YLW CONV (MISCELLANEOUS) ×2 IMPLANT
PENCIL BUTTON HOLSTER BLD 10FT (ELECTRODE) ×2 IMPLANT
POUCH SPECIMEN RETRIEVAL 10MM (ENDOMECHANICALS) ×2 IMPLANT
SCISSORS LAP 5X35 DISP (ENDOMECHANICALS) IMPLANT
SET IRRIG TUBING LAPAROSCOPIC (IRRIGATION / IRRIGATOR) ×2 IMPLANT
SLEEVE ENDOPATH XCEL 5M (ENDOMECHANICALS) ×2 IMPLANT
SPECIMEN JAR MEDIUM (MISCELLANEOUS) ×1 IMPLANT
SPECIMEN JAR SMALL (MISCELLANEOUS) ×1 IMPLANT
SUT MNCRL AB 4-0 PS2 18 (SUTURE) ×4 IMPLANT
SUT VICRYL 0 UR6 27IN ABS (SUTURE) ×2 IMPLANT
TOWEL OR 17X24 6PK STRL BLUE (TOWEL DISPOSABLE) ×2 IMPLANT
TOWEL OR 17X26 10 PK STRL BLUE (TOWEL DISPOSABLE) ×2 IMPLANT
TRAY FOLEY CATH 14FR (SET/KITS/TRAYS/PACK) IMPLANT
TRAY LAPAROSCOPIC (CUSTOM PROCEDURE TRAY) ×2 IMPLANT
TROCAR HASSON GELL 12X100 (TROCAR) ×2 IMPLANT
TROCAR XCEL NON-BLD 11X100MML (ENDOMECHANICALS) ×2 IMPLANT
TROCAR XCEL NON-BLD 5MMX100MML (ENDOMECHANICALS) ×2 IMPLANT

## 2011-08-20 NOTE — Brief Op Note (Signed)
08/20/2011  1:02 PM  PATIENT:  Ellis Parents  46 y.o. female  PRE-OPERATIVE DIAGNOSIS:  BILIARY DYSKINESIA  POST-OPERATIVE DIAGNOSIS:  BILIARY DYSKINESIA  PROCEDURE:  Procedure(s) (LRB): LAPAROSCOPIC CHOLECYSTECTOMY WITH INTRAOPERATIVE CHOLANGIOGRAM (N/A)  SURGEON:  Surgeon(s) and Role:    * Lodema Pilot, DO - Primary  PHYSICIAN ASSISTANT:   ASSISTANTS: none   ANESTHESIA:   general  EBL:  Total I/O In: 1000 [I.V.:1000] Out: -   BLOOD ADMINISTERED:none  DRAINS: none   LOCAL MEDICATIONS USED:  MARCAINE    and LIDOCAINE   SPECIMEN:  Source of Specimen:  gallbladder  DISPOSITION OF SPECIMEN:  PATHOLOGY  COUNTS:  YES  TOURNIQUET:  * No tourniquets in log *  DICTATION: .Other Dictation: Dictation Number   PLAN OF CARE: Discharge to home after PACU  PATIENT DISPOSITION:  PACU - hemodynamically stable.   Delay start of Pharmacological VTE agent (>24hrs) due to surgical blood loss or risk of bleeding: no

## 2011-08-20 NOTE — Discharge Instructions (Signed)
Laparoscopic Cholecystectomy Care After These instructions give you information on caring for yourself after your procedure. Your doctor may also give you more specific instructions. Call your doctor if you have any problems or questions after your procedure. HOME CARE  Change your bandages (dressings) as told by your doctor.   Keep the wound dry and clean. Wash the wound gently with soap and water. Pat the wound dry with a clean towel.   Do not take baths, swim, or use hot tubs for 10 days, or as told by your doctor.   Only take medicine as told by your doctor.   Eat a normal diet as told by your doctor.   Do not lift anything heavier than 25 pounds (11.5 kg), or as told by your doctor.   Do not play contact sports for 1 week, or as told by your doctor.  GET HELP RIGHT AWAY IF:   Your wound is red, puffy (swollen), or painful.   You have yellowish-white fluid (pus) coming from the wound.   You have fluid draining from the wound for more than 1 day.   You have a bad smell coming from the wound.   Your wound breaks open.   You have a rash.   You have trouble breathing.   You have chest pain.   You have a bad reaction to your medicine.   You have a fever.   You have pain in the shoulders (shoulder strap areas).   You feel dizzy or pass out (faint).   You have severe belly (abdominal) pain.   You feel sick to your stomach (nauseous) or throw up (vomit) for more than 1 day.  MAKE SURE YOU:  Understand these instructions.   Will watch your condition.   Will get help right away if you are not doing well or get worse.  Document Released: 01/21/2008 Document Revised: 04/02/2011 Document Reviewed: 09/30/2010 [employer] Patient Information 2012 [employer]. 

## 2011-08-20 NOTE — Transfer of Care (Signed)
Immediate Anesthesia Transfer of Care Note  Patient: Sherri Hull  Procedure(s) Performed: Procedure(s) (LRB): LAPAROSCOPIC CHOLECYSTECTOMY WITH INTRAOPERATIVE CHOLANGIOGRAM (N/A)  Patient Location: PACU  Anesthesia Type: General  Level of Consciousness: awake, alert  and oriented  Airway & Oxygen Therapy: Patient Spontanous Breathing and Patient connected to face mask oxygen  Post-op Assessment: Report given to PACU RN, Post -op Vital signs reviewed and stable and Patient moving all extremities X 4  Post vital signs: Reviewed and stable  Complications: No apparent anesthesia complications

## 2011-08-20 NOTE — Op Note (Signed)
Sherri Hull, Sherri Hull NO.:  [id number]  MEDICAL RECORD NO.:  [id number]  LOCATION:  [facility]                         FACILITY:  [facility]  PHYSICIAN:  Lodema Pilot, MD       DATE OF BIRTH:  February 05, 1966  DATE OF PROCEDURE:  08/20/2011 DATE OF DISCHARGE:                              OPERATIVE REPORT   PROCEDURE:  Laparoscopic cholecystectomy with intraoperative cholangiogram.  PREOPERATIVE DIAGNOSIS:  Biliary dyskinesia.  POSTOP DIAGNOSIS:  Biliary dyskinesia.  SURGEON:  Lodema Pilot, MD  ASSISTANT:  None.  ANESTHESIA:  General endotracheal anesthesia with 26 mL of 1% lidocaine with epinephrine and 0.25% Marcaine plain.  FLUIDS:  1100 mL crystalloid.  ESTIMATED BLOOD LOSS:  Minimal.  DRAINS:  None.  SPECIMENS:  Gallbladder and contents sent to pathology for permanent section.  INDICATION FOR PROCEDURE:  Sherri Hull has postprandial abdominal pain and has seen gastroenterologist for evaluation of this.  She had ultrasound which was negative for gallstones, but she did have a HIDA scan, which demonstrated some biliary dyskinesia with ejection fraction of 12 to 13%.  OPERATIVE DETAILS:  Sherri Hull was seen and evaluated in the preoperative area and risks and benefits of procedure were again discussed in lay terms.  Informed consent was obtained, again discussed with her the risks and possibility of persistent symptoms after her procedure specially given the absence of gallstones, and she expressed understanding and desired to proceed with cholecystectomy.  Prophylactic antibiotics were given, and she was taken to operating room, placed on table in supine position, and general endotracheal anesthesia was obtained.  Her abdomen was prepped and draped in the standard surgical fashion, and a supraumbilical midline incision was made in the skin and dissection carried down through subcutaneous tissue using blunt dissection.  The abdominal wall fascia was elevated and  sharply incised, and the peritoneum entered bluntly.  The 12-mm balloon port was placed into the abdomen, and the pneumoperitoneum was obtained.  There was no evidence of bowel injury upon entry, and two 5 mm right upper quadrant trocars and one 11 mm epigastric trocar was replaced under direct visualization, and the gallbladder was retracted cephalad.  There was some fatty adhesions around the cystic duct, but the peritoneum and fat was taken down using blunt dissection, the cystic artery was identified in its usual position on the medial aspect of the gallbladder coursing up onto the gallbladder, and this was skeletonized and clipped between hemoclips with 2 clips on the stay side.  Then, the cystic duct was skeletonized high on the gallbladder and a critical view of safety was obtained visualizing a single cystic duct entering gallbladder and visualizing liver parenchyma through the triangle of Calot.  A clip was placed on the gallbladder side of the cystic duct and the cholangiogram catheter was passed into the abdominal wall using a 14-gauge angiocatheter.  A small cystic ductotomy was made, and a cholangiogram catheter was passed into the cystic duct and clipped into place. Cholangiogram was performed which demonstrated a long cystic duct, normal right and left hepatic ducts, normal filling of the common bile duct, without any evidence of any filling defects and free flow of bile  into the duodenum.  Cholangiogram catheter was removed, and clip was placed on the cystic duct stump and 0 PDS endo-loop was also placed around the cystic duct stump.  Then, the gallbladder was removed from the gallbladder fossa using Bovie electrocautery, and the gallbladder was not entered during the dissection, it was completely removed and placed the EndoCatch bag and removed from the umbilical trocar site in an EndoCatch bag.  I did not palpate any gallstones, and this was sent to pathology for  permanent section.  The abdomen was explored and the right upper quadrant was irrigated with sterile saline solution.  The irrigation returned clear.  There was no evidence of bleeding or bowel injury identified.  Clips and the loops appeared to be in good position. The gallbladder fossa was hemostatic.  The remainder of the right upper quadrant ports were removed under direct visualization, and the umbilical fascia was approximated with interrupted 0 Vicryl sutures in open fashion.  Sutures were secured and the abdomen reinsufflated with carbon dioxide gas.  The closure was noted be adequate without any evidence of bowel injury.  The final trocar was removed, and the skin and wounds were injected with a total of 26 mL of 1% lidocaine with epinephrine and 0.25% of Marcaine in a 50:50 mixture.  Skin edges were approximated with 4-0 Monocryl subcuticular suture.  Skin was washed and dried and Dermabond was applied.  All sponge, needle, instrument counts were correct in the case.  The patient tolerated the procedure well without apparent complications.          ______________________________ Lodema Pilot, MD     BL/MEDQ  D:  08/20/2011  T:  08/20/2011  Job:  [id number]

## 2011-08-20 NOTE — Progress Notes (Signed)
DRANK COKE AND ATE CRACKERS; NO C/O NAUSEA OR PAIN; UP TO BR AND VOIDED AND TOL WELL

## 2011-08-20 NOTE — Progress Notes (Signed)
Report to Stephanie RN as caregiver 

## 2011-08-20 NOTE — Anesthesia Preprocedure Evaluation (Signed)
Anesthesia Evaluation  Patient identified by MRN, date of birth, ID band Patient awake    Reviewed: H&P , NPO status , Patient's Chart, lab work & pertinent test results  History of Anesthesia Complications (+) PONV  Airway Mallampati: II TM Distance: >3 FB Neck ROM: Full    Dental  (+) Dental Advisory Given, Teeth Intact and Chipped   Pulmonary neg pulmonary ROS,  breath sounds clear to auscultation  Pulmonary exam normal       Cardiovascular negative cardio ROS  Rhythm:Regular Rate:Normal     Neuro/Psych negative neurological ROS     GI/Hepatic GERD-  Controlled,N/V with gallbladder   Endo/Other  Morbid obesity  Renal/GU negative Renal ROS     Musculoskeletal   Abdominal (+) + obese,   Peds  Hematology negative hematology ROS (+)   Anesthesia Other Findings   Reproductive/Obstetrics                           Anesthesia Physical Anesthesia Plan  ASA: II  Anesthesia Plan: General   Post-op Pain Management:    Induction: Intravenous and Rapid sequence  Airway Management Planned: Oral ETT  Additional Equipment:   Intra-op Plan:   Post-operative Plan: Extubation in OR  Informed Consent: I have reviewed the patients History and Physical, chart, labs and discussed the procedure including the risks, benefits and alternatives for the proposed anesthesia with the patient or authorized representative who has indicated his/her understanding and acceptance.   Dental advisory given  Plan Discussed with: CRNA and Surgeon  Anesthesia Plan Comments: (Plan routine monitors, GETA)        Anesthesia Quick Evaluation

## 2011-08-20 NOTE — Interval H&P Note (Signed)
History and Physical Interval Note:  08/20/2011 11:16 AM  Sherri Hull  has presented today for surgery, with the diagnosis of biliary dyskinesia  The various methods of treatment have been discussed with the patient and family. After consideration of risks, benefits and other options for treatment, the patient has consented to  Procedure(s) (LRB): LAPAROSCOPIC CHOLECYSTECTOMY WITH INTRAOPERATIVE CHOLANGIOGRAM (N/A) as a surgical intervention .  The patients' history has been reviewed, patient examined, no change in status, stable for surgery.  I have reviewed the patients' chart and labs.  Questions were answered to the patient's satisfaction.  The risks of infection, bleeding, pain, persistent symptoms, scarring, injury to bowel or bile ducts, retained stone, diarrhea, need for additional procedures, and need for open surgery discussed with the patient. I again explained that since no gallstones were seen, her biggest risk would be persistence of symptoms and she expressed understanding and desires to proceed with cholecystectomy    Lodema Pilot DAVID

## 2011-08-20 NOTE — H&P (View-Only) (Signed)
Patient ID: Sherri Hull, female   DOB: 02/02/1966, 46 y.o.   MRN: [id number]  Chief Complaint  Patient presents with  . Abdominal Pain    HPI Sherri Hull is a 46 y.o. female.  Referred by Dr. Evette Cristal for eval of RUQ abdominal pain after eating.  Pain radiates to her back and has associated nausea but only rare/occasional vomiting.  She has had the pain for several months but says that it has been more frequent and her last episode was last Saturday after eating spaghetti.  She says that the pain usually comes first and leads to the nausea.  She also has some diarrhea with fatty foods but denies any other symptoms such as fevers or chills.  She had an [place] which was negative for cholelithiasis and this was followed by a [place] which showed a 12% EF and she was referred for surgical consultation. LFT's were normal. HPI  No past medical history on file.  Past Surgical History  Procedure Date  . Abdominal hysterectomy 2009    No family history on file.  Social History History  Substance Use Topics  . Smoking status: Never Smoker   . Smokeless tobacco: Not on file  . Alcohol Use: No    No Known Allergies  No current outpatient prescriptions on file.    Review of Systems Review of Systems All other review of systems negative or noncontributory except as stated in the HPI  Blood pressure 134/86, pulse 60, temperature 97.7 F (36.5 C), height 5\' 7"  (1.702 m), weight 241 lb (109.317 kg).  Physical Exam Physical Exam Physical Exam  Nursing note and vitals reviewed. Constitutional: She is oriented to person, place, and time. She appears well-developed and well-nourished. No distress.  HENT:  Head: Normocephalic and atraumatic.  Mouth/Throat: No oropharyngeal exudate.  Eyes: Conjunctivae and EOM are normal. Pupils are equal, round, and reactive to light. Right eye exhibits no discharge. Left eye exhibits no discharge. No scleral icterus.  Neck: Normal range of motion. Neck supple. No  tracheal deviation present.  Cardiovascular: Normal rate, regular rhythm, normal heart sounds and intact distal pulses.   Pulmonary/Chest: Effort normal and breath sounds normal. No stridor. No respiratory distress. She has no wheezes.  Abdominal: Soft. Bowel sounds are normal. She exhibits no distension and no mass. There is no tenderness. There is no rebound and no guarding.  Musculoskeletal: Normal range of motion. She exhibits no edema and no tenderness.  Neurological: She is alert and oriented to person, place, and time.  Skin: Skin is warm and dry. No rash noted. She is not diaphoretic. No erythema. No pallor.  Psychiatric: She has a normal mood and affect. Her behavior is normal. Judgment and thought content normal.    Data Reviewed [place], HIDA, Labs  Assessment    Biliary dyskinesia She has symptoms which do sound biliary in nature and with a HIDA of 12% I think that it is reasonable to offer cholecystectomy.  I did explain that her biggest risks would be persistent symptoms/failure of relief and diarrhea.  We also discussed the other risks of the procedure including infection, bleeding, pain, scarring, need for open, injury to bowel or bile ducts, and diarrhea since she is already having symptoms of this. She expressed understanding and desires to proceed with lap cholecystectomy.    Plan    Will schedule as soon as available.       Lodema Pilot DAVID 07/23/2011, 8:08 PM

## 2011-08-20 NOTE — Anesthesia Procedure Notes (Signed)
Procedure Name: Intubation Date/Time: 08/20/2011 11:48 AM Performed by: Quentin Ore Pre-anesthesia Checklist: Patient identified, Emergency Drugs available, Suction available, Patient being monitored and Timeout performed Patient Re-evaluated:Patient Re-evaluated prior to inductionOxygen Delivery Method: Circle system utilized Preoxygenation: Pre-oxygenation with 100% oxygen Intubation Type: IV induction Ventilation: Mask ventilation without difficulty and Oral airway inserted - appropriate to patient size Laryngoscope Size: Mac and 3 Grade View: Grade III Tube type: Oral Tube size: 7.0 mm Number of attempts: 1 Airway Equipment and Method: Stylet Placement Confirmation: ETT inserted through vocal cords under direct vision,  positive ETCO2 and breath sounds checked- equal and bilateral Secured at: 20 cm Tube secured with: Tape Dental Injury: Teeth and Oropharynx as per pre-operative assessment

## 2011-08-20 NOTE — Anesthesia Postprocedure Evaluation (Signed)
  Anesthesia Post-op Note  Patient: Sherri Hull  Procedure(s) Performed: Procedure(s) (LRB): LAPAROSCOPIC CHOLECYSTECTOMY WITH INTRAOPERATIVE CHOLANGIOGRAM (N/A)  Patient Location: PACU  Anesthesia Type: General  Level of Consciousness: awake, alert  and oriented  Airway and Oxygen Therapy: Patient Spontanous Breathing  Post-op Pain: none  Post-op Assessment: Post-op Vital signs reviewed, Patient's Cardiovascular Status Stable, Respiratory Function Stable, Patent Airway, No signs of Nausea or vomiting and Pain level controlled  Post-op Vital Signs: Reviewed and stable  Complications: No apparent anesthesia complications

## 2011-08-20 NOTE — Preoperative (Signed)
Beta Blockers   Reason not to administer Beta Blockers:Not Applicable 

## 2011-08-21 ENCOUNTER — Encounter (HOSPITAL_COMMUNITY): Payer: Self-pay | Admitting: General Surgery

## 2011-09-09 ENCOUNTER — Encounter (INDEPENDENT_AMBULATORY_CARE_PROVIDER_SITE_OTHER): Payer: 59 | Admitting: General Surgery

## 2012-05-02 ENCOUNTER — Other Ambulatory Visit: Payer: Self-pay | Admitting: Family Medicine

## 2012-05-02 DIAGNOSIS — M79604 Pain in right leg: Secondary | ICD-10-CM

## 2012-05-02 DIAGNOSIS — M7989 Other specified soft tissue disorders: Secondary | ICD-10-CM

## 2012-05-03 ENCOUNTER — Ambulatory Visit
Admission: RE | Admit: 2012-05-03 | Discharge: 2012-05-03 | Disposition: A | Discharge status: Home / Self Care | Payer: 59 | Source: Ambulatory Visit | Attending: Family Medicine | Admitting: Family Medicine

## 2012-05-03 DIAGNOSIS — M7989 Other specified soft tissue disorders: Secondary | ICD-10-CM

## 2012-05-03 DIAGNOSIS — M79604 Pain in right leg: Secondary | ICD-10-CM

## 2012-12-07 ENCOUNTER — Emergency Department (HOSPITAL_BASED_OUTPATIENT_CLINIC_OR_DEPARTMENT_OTHER): Payer: 59

## 2012-12-07 ENCOUNTER — Encounter (HOSPITAL_BASED_OUTPATIENT_CLINIC_OR_DEPARTMENT_OTHER): Payer: Self-pay | Admitting: *Deleted

## 2012-12-07 ENCOUNTER — Emergency Department (HOSPITAL_BASED_OUTPATIENT_CLINIC_OR_DEPARTMENT_OTHER)
Admission: EM | Admit: 2012-12-07 | Discharge: 2012-12-07 | Disposition: A | Discharge status: Home / Self Care | Payer: 59 | Attending: Emergency Medicine | Admitting: Emergency Medicine

## 2012-12-07 DIAGNOSIS — R6883 Chills (without fever): Secondary | ICD-10-CM | POA: Insufficient documentation

## 2012-12-07 DIAGNOSIS — Z90710 Acquired absence of both cervix and uterus: Secondary | ICD-10-CM | POA: Insufficient documentation

## 2012-12-07 DIAGNOSIS — Z87442 Personal history of urinary calculi: Secondary | ICD-10-CM | POA: Insufficient documentation

## 2012-12-07 DIAGNOSIS — R1031 Right lower quadrant pain: Secondary | ICD-10-CM | POA: Insufficient documentation

## 2012-12-07 DIAGNOSIS — R110 Nausea: Secondary | ICD-10-CM | POA: Insufficient documentation

## 2012-12-07 DIAGNOSIS — R300 Dysuria: Secondary | ICD-10-CM | POA: Insufficient documentation

## 2012-12-07 DIAGNOSIS — R630 Anorexia: Secondary | ICD-10-CM | POA: Insufficient documentation

## 2012-12-07 DIAGNOSIS — R109 Unspecified abdominal pain: Secondary | ICD-10-CM

## 2012-12-07 HISTORY — DX: Calculus of kidney: N20.0

## 2012-12-07 LAB — COMPREHENSIVE METABOLIC PANEL
AST: 25 U/L (ref 0–37)
Albumin: 4.2 g/dL (ref 3.5–5.2)
Chloride: 103 mEq/L (ref 96–112)
Creatinine, Ser: 0.7 mg/dL (ref 0.50–1.10)
Potassium: 3.9 mEq/L (ref 3.5–5.1)
Total Bilirubin: 0.3 mg/dL (ref 0.3–1.2)
Total Protein: 7.4 g/dL (ref 6.0–8.3)

## 2012-12-07 LAB — CBC WITH DIFFERENTIAL/PLATELET
Basophils Absolute: 0 10*3/uL (ref 0.0–0.1)
Basophils Relative: 0 % (ref 0–1)
MCHC: 33.8 g/dL (ref 30.0–36.0)
Monocytes Absolute: 0.4 10*3/uL (ref 0.1–1.0)
Neutro Abs: 2.1 10*3/uL (ref 1.7–7.7)
Neutrophils Relative %: 43 % (ref 43–77)
RDW: 12.4 % (ref 11.5–15.5)

## 2012-12-07 LAB — URINALYSIS, ROUTINE W REFLEX MICROSCOPIC
Ketones, ur: NEGATIVE mg/dL
Leukocytes, UA: NEGATIVE
Nitrite: NEGATIVE
Protein, ur: NEGATIVE mg/dL

## 2012-12-07 MED ORDER — IOHEXOL 300 MG/ML  SOLN
50.0000 mL | Freq: Once | INTRAMUSCULAR | Status: AC | PRN
  Administered 2012-12-07: 50 mL via ORAL

## 2012-12-07 MED ORDER — SODIUM CHLORIDE 0.9 % IV BOLUS (SEPSIS)
1000.0000 mL | Freq: Once | INTRAVENOUS | Status: AC
  Administered 2012-12-07: 1000 mL via INTRAVENOUS

## 2012-12-07 MED ORDER — ONDANSETRON HCL 4 MG/2ML IJ SOLN
4.0000 mg | Freq: Once | INTRAMUSCULAR | Status: AC
  Administered 2012-12-07: 4 mg via INTRAVENOUS
  Filled 2012-12-07: qty 2

## 2012-12-07 MED ORDER — IOHEXOL 300 MG/ML  SOLN
100.0000 mL | Freq: Once | INTRAMUSCULAR | Status: AC | PRN
  Administered 2012-12-07: 100 mL via INTRAVENOUS

## 2012-12-07 MED ORDER — HYDROCODONE-ACETAMINOPHEN 5-325 MG PO TABS: 1.0000 | ORAL_TABLET | Freq: Four times a day (QID) | ORAL | Status: DC | PRN

## 2012-12-07 MED ORDER — HYDROMORPHONE HCL PF 1 MG/ML IJ SOLN
1.0000 mg | Freq: Once | INTRAMUSCULAR | Status: AC
  Administered 2012-12-07: 1 mg via INTRAVENOUS
  Filled 2012-12-07: qty 1

## 2012-12-07 MED ORDER — ONDANSETRON HCL 4 MG PO TABS: 4.0000 mg | ORAL_TABLET | Freq: Four times a day (QID) | ORAL | Status: DC

## 2012-12-07 NOTE — ED Provider Notes (Addendum)
CSN: [id number]     Arrival date & time 12/07/12  1134 History     First MD Initiated Contact with Patient 12/07/12 1200     Chief Complaint  Patient presents with  . Abdominal Pain   (Consider location/radiation/quality/duration/timing/severity/associated sxs/prior Treatment) Patient is a 47 y.o. female presenting with abdominal pain. The history is provided by the patient.  Abdominal Pain Pain location:  RLQ Pain quality: gnawing, shooting and stabbing   Pain radiates to:  Does not radiate Pain severity:  Moderate Onset quality:  Gradual Duration:  2 days Timing:  Constant Progression:  Worsening Chronicity:  New Context comment:  Pain started 2 days ago but not improving Relieved by:  Nothing Worsened by:  Movement, urination and eating (also today some mild worsening of pain with urination) Ineffective treatments:  None tried Associated symptoms: anorexia, chills, dysuria and nausea   Associated symptoms: no belching, no chest pain, no constipation, no diarrhea, no fever, no vaginal bleeding, no vaginal discharge and no vomiting   Risk factors: no recent hospitalization   Risk factors comment:  Status post cholecystectomy and abdominal hysterectomy   Past Medical History  Diagnosis Date  . PONV (postoperative nausea and vomiting)   . No pertinent past medical history   . Nausea     x 2mos  . Kidney stone    Past Surgical History  Procedure Laterality Date  . Abdominal hysterectomy  2009  . Cholecystectomy  08/20/2011    Procedure: LAPAROSCOPIC CHOLECYSTECTOMY WITH INTRAOPERATIVE CHOLANGIOGRAM;  Surgeon: Lodema Pilot, DO;  Location: [facility] OR;  Service: General;  Laterality: N/A;   No family history on file. History  Substance Use Topics  . Smoking status: Never Smoker   . Smokeless tobacco: Not on file  . Alcohol Use: No   OB History   Grav Para Term Preterm Abortions TAB SAB Ect Mult Living                 Review of Systems  Constitutional: Positive for  chills. Negative for fever.  Cardiovascular: Negative for chest pain.  Gastrointestinal: Positive for nausea, abdominal pain and anorexia. Negative for vomiting, diarrhea and constipation.  Genitourinary: Positive for dysuria. Negative for vaginal bleeding and vaginal discharge.  All other systems reviewed and are negative.    Allergies  Review of patient's allergies indicates no known allergies.  Home Medications  No current outpatient prescriptions on file. BP 163/100  Temp(Src) 98.7 F (37.1 C) (Oral)  Resp 20  Ht 5\' 7"  (1.702 m)  Wt 190 lb (86.183 kg)  BMI 29.75 kg/m2  SpO2 100% Physical Exam  Nursing note and vitals reviewed. Constitutional: She is oriented to person, place, and time. She appears well-developed and well-nourished. She appears distressed.  HENT:  Head: Normocephalic and atraumatic.  Eyes: EOM are normal. Pupils are equal, round, and reactive to light.  Cardiovascular: Normal rate, regular rhythm, normal heart sounds and intact distal pulses.  Exam reveals no friction rub.   No murmur heard. Pulmonary/Chest: Effort normal and breath sounds normal. She has no wheezes. She has no rales.  Abdominal: Soft. Bowel sounds are normal. She exhibits no distension. There is tenderness in the right lower quadrant. There is rebound and guarding. There is no CVA tenderness. No hernia.  Musculoskeletal: Normal range of motion. She exhibits no tenderness.  No edema  Neurological: She is alert and oriented to person, place, and time. No cranial nerve deficit.  Skin: Skin is warm and dry. No rash noted.  Psychiatric:  She has a normal mood and affect. Her behavior is normal.    ED Course   Procedures (including critical care time)  Labs Reviewed  COMPREHENSIVE METABOLIC PANEL - Abnormal; Notable for the following:    Glucose, Bld 105 (*)    All other components within normal limits  URINALYSIS, ROUTINE W REFLEX MICROSCOPIC  CBC WITH DIFFERENTIAL  LIPASE, BLOOD   Ct  Abdomen Pelvis W Contrast  12/07/2012   *RADIOLOGY REPORT*  Clinical Data: Abdominal pain, umbilical pain, nausea, chills. Prior cholecystectomy and hysterectomy  CT ABDOMEN AND PELVIS WITH CONTRAST  Technique:  Multidetector CT imaging of the abdomen and pelvis was performed following the standard protocol during bolus administration of intravenous contrast.  Contrast: 50mL OMNIPAQUE IOHEXOL 300 MG/ML  SOLN, OMNIPAQUE IOHEXOL 300 MG/ML  SOLN  Comparison: 07/09/2011 ultrasound  Findings: Clear lung bases.  Normal heart size.  No pericardial or pleural effusion.  No hiatal hernia.  Abdomen:  Diffuse hypoattenuation of the liver parenchyma compatible with hepatic steatosis.  No biliary dilatation.  Patent portal vein.  Prior cholecystectomy evident.  Biliary system, pancreas, spleen, adrenal glands, and kidneys are within normal limits for age and demonstrate no acute process.  No renal obstruction or hydronephrosis.  8 mm left mid pole renal cortical cyst noted, image 43.  Negative for bowel obstruction, dilatation, ileus, or free air.  No abdominal free fluid, fluid collection, hemorrhage, abscess, or adenopathy.  Terminal ileum and appendix are normal in the right lower quadrant.  Pelvis:  Prior hysterectomy noted.  No pelvic free fluid, fluid collection, hemorrhage, abscess, adenopathy, inguinal abnormality, or hernia.  Urinary bladder unremarkable.  Distal colon is collapsed.  Degenerative changes of the lower lumbar spine, most pronounced at L4-5 and L5-S1.  IMPRESSION: Prior cholecystectomy and hysterectomy  Hepatic steatosis  No acute intra-abdominal or pelvic process  Negative for appendicitis   Original Report Authenticated By: Judie Petit. Shick, M.D.   1. Abdominal  pain, other specified site     MDM   Patient with onset of right lower quadrant abdominal pain that started 2 days ago with nausea and no vomiting. She denies any flank pain and complete abdominal hysterectomy. She's also having normal  bowel movements and the pain is worse with movement and eating.  Patient has rebound and guarding in the right lower quadrant.  Concern for appendicitis at this time but also could be kidney stone as she does have hx of right sided stone.  Unable to ovarian pathology as pt has had them surgically removed.  CBC, CMP, lipase, UA, CT abdomen and pelvis pending.  All labs and CT neg.  Pt improved after pain meds.  Will d/c home with strict return precautions.  Gwyneth Sprout, MD 12/07/12 1337  Gwyneth Sprout, MD 12/07/12 1340

## 2012-12-07 NOTE — ED Notes (Signed)
Patient states she has a two day history of RLQ abdominal pain associated with chills and nausea.

## 2013-01-05 ENCOUNTER — Observation Stay (HOSPITAL_BASED_OUTPATIENT_CLINIC_OR_DEPARTMENT_OTHER)
Admission: EM | Admit: 2013-01-05 | Discharge: 2013-01-06 | Disposition: A | Discharge status: Home / Self Care | Payer: 59 | Attending: Internal Medicine | Admitting: Internal Medicine

## 2013-01-05 ENCOUNTER — Encounter (HOSPITAL_BASED_OUTPATIENT_CLINIC_OR_DEPARTMENT_OTHER): Payer: Self-pay | Admitting: Emergency Medicine

## 2013-01-05 ENCOUNTER — Emergency Department (HOSPITAL_BASED_OUTPATIENT_CLINIC_OR_DEPARTMENT_OTHER): Payer: 59

## 2013-01-05 DIAGNOSIS — M940 Chondrocostal junction syndrome [Tietze]: Secondary | ICD-10-CM

## 2013-01-05 DIAGNOSIS — I10 Essential (primary) hypertension: Secondary | ICD-10-CM | POA: Insufficient documentation

## 2013-01-05 DIAGNOSIS — R079 Chest pain, unspecified: Principal | ICD-10-CM | POA: Insufficient documentation

## 2013-01-05 DIAGNOSIS — E785 Hyperlipidemia, unspecified: Secondary | ICD-10-CM

## 2013-01-05 HISTORY — DX: Essential (primary) hypertension: I10

## 2013-01-05 LAB — CBC WITH DIFFERENTIAL/PLATELET
Eosinophils Relative: 2 % (ref 0–5)
HCT: 44.3 % (ref 36.0–46.0)
Hemoglobin: 15 g/dL (ref 12.0–15.0)
Lymphocytes Relative: 50 % — ABNORMAL HIGH (ref 12–46)
Lymphs Abs: 3.7 10*3/uL (ref 0.7–4.0)
MCV: 89.5 fL (ref 78.0–100.0)
Monocytes Absolute: 0.7 10*3/uL (ref 0.1–1.0)
Monocytes Relative: 9 % (ref 3–12)
RBC: 4.95 MIL/uL (ref 3.87–5.11)
RDW: 12.7 % (ref 11.5–15.5)
WBC: 7.4 10*3/uL (ref 4.0–10.5)

## 2013-01-05 LAB — COMPREHENSIVE METABOLIC PANEL
BUN: 20 mg/dL (ref 6–23)
CO2: 29 mEq/L (ref 19–32)
Calcium: 10 mg/dL (ref 8.4–10.5)
Chloride: 100 mEq/L (ref 96–112)
Creatinine, Ser: 1 mg/dL (ref 0.50–1.10)
GFR calc Af Amer: 78 mL/min — ABNORMAL LOW (ref >90)
GFR calc non Af Amer: 67 mL/min — ABNORMAL LOW (ref >90)
Glucose, Bld: 132 mg/dL — ABNORMAL HIGH (ref 70–99)
Total Bilirubin: 0.2 mg/dL — ABNORMAL LOW (ref 0.3–1.2)

## 2013-01-05 NOTE — ED Notes (Signed)
Pt c/o central chest tightness that started about 15 min PTA while laying down. Pt also reports shob, nausea, lightheadedness. Pt states she was seen earlier this week at [facility] and dx with HTN and started on benicar.

## 2013-01-06 ENCOUNTER — Encounter (HOSPITAL_COMMUNITY): Payer: Self-pay | Admitting: Internal Medicine

## 2013-01-06 DIAGNOSIS — I10 Essential (primary) hypertension: Secondary | ICD-10-CM

## 2013-01-06 DIAGNOSIS — R079 Chest pain, unspecified: Principal | ICD-10-CM

## 2013-01-06 DIAGNOSIS — I519 Heart disease, unspecified: Secondary | ICD-10-CM

## 2013-01-06 DIAGNOSIS — M940 Chondrocostal junction syndrome [Tietze]: Secondary | ICD-10-CM | POA: Diagnosis present

## 2013-01-06 DIAGNOSIS — E785 Hyperlipidemia, unspecified: Secondary | ICD-10-CM | POA: Diagnosis present

## 2013-01-06 LAB — LIPID PANEL
HDL: 35 mg/dL — ABNORMAL LOW (ref >39)
LDL Cholesterol: 147 mg/dL — ABNORMAL HIGH (ref 0–99)

## 2013-01-06 LAB — TROPONIN I
Troponin I: <0.3 ng/mL (ref <0.30)
Troponin I: <0.3 ng/mL (ref <0.30)
Troponin I: <0.3 ng/mL (ref <0.30)
Troponin I: <0.3 ng/mL (ref <0.30)

## 2013-01-06 LAB — CBC
MCHC: 33.3 g/dL (ref 30.0–36.0)
Platelets: 185 10*3/uL (ref 150–400)
RDW: 12.8 % (ref 11.5–15.5)
WBC: 5.4 10*3/uL (ref 4.0–10.5)

## 2013-01-06 LAB — MRSA PCR SCREENING: MRSA by PCR: POSITIVE — AB

## 2013-01-06 LAB — CREATININE, SERUM
Creatinine, Ser: 0.74 mg/dL (ref 0.50–1.10)
GFR calc Af Amer: >90 mL/min (ref >90)
GFR calc non Af Amer: >90 mL/min (ref >90)

## 2013-01-06 MED ORDER — ASPIRIN EC 81 MG PO TBEC: 81.0000 mg | DELAYED_RELEASE_TABLET | Freq: Every day | ORAL | Status: AC

## 2013-01-06 MED ORDER — NITROGLYCERIN 0.4 MG SL SUBL: 0.4000 mg | SUBLINGUAL_TABLET | SUBLINGUAL | Status: AC | PRN

## 2013-01-06 MED ORDER — MUPIROCIN 2 % EX OINT
1.0000 "application " | TOPICAL_OINTMENT | Freq: Two times a day (BID) | CUTANEOUS | Status: DC
  Administered 2013-01-06: 1 via NASAL
  Filled 2013-01-06: qty 22

## 2013-01-06 MED ORDER — MORPHINE SULFATE 2 MG/ML IJ SOLN: 1.0000 mg | INTRAMUSCULAR | Status: DC | PRN

## 2013-01-06 MED ORDER — SODIUM CHLORIDE 0.9 % IV SOLN
INTRAVENOUS | Status: DC
  Administered 2013-01-06: 01:00:00 via INTRAVENOUS

## 2013-01-06 MED ORDER — IRBESARTAN 300 MG PO TABS
300.0000 mg | ORAL_TABLET | Freq: Every day | ORAL | Status: DC
  Administered 2013-01-06: 300 mg via ORAL
  Filled 2013-01-06: qty 1

## 2013-01-06 MED ORDER — SODIUM CHLORIDE 0.9 % IV BOLUS (SEPSIS)
250.0000 mL | Freq: Once | INTRAVENOUS | Status: AC
  Administered 2013-01-06: 250 mL via INTRAVENOUS

## 2013-01-06 MED ORDER — ONDANSETRON HCL 4 MG/2ML IJ SOLN
4.0000 mg | Freq: Once | INTRAMUSCULAR | Status: AC
  Administered 2013-01-06: 4 mg via INTRAVENOUS
  Filled 2013-01-06: qty 2

## 2013-01-06 MED ORDER — ATORVASTATIN CALCIUM 20 MG PO TABS
20.0000 mg | ORAL_TABLET | Freq: Every day | ORAL | Status: DC
  Filled 2013-01-06: qty 1

## 2013-01-06 MED ORDER — SODIUM CHLORIDE 0.9 % IJ SOLN
3.0000 mL | Freq: Two times a day (BID) | INTRAMUSCULAR | Status: DC
  Administered 2013-01-06: 10:00:00 via INTRAVENOUS

## 2013-01-06 MED ORDER — ENOXAPARIN SODIUM 40 MG/0.4ML ~~LOC~~ SOLN
40.0000 mg | SUBCUTANEOUS | Status: DC
  Administered 2013-01-06: 40 mg via SUBCUTANEOUS
  Filled 2013-01-06: qty 0.4

## 2013-01-06 MED ORDER — ASPIRIN 81 MG PO CHEW
324.0000 mg | CHEWABLE_TABLET | Freq: Once | ORAL | Status: AC
  Administered 2013-01-06: 324 mg via ORAL
  Filled 2013-01-06: qty 4

## 2013-01-06 MED ORDER — ONDANSETRON HCL 4 MG PO TABS: 4.0000 mg | ORAL_TABLET | Freq: Four times a day (QID) | ORAL | Status: DC | PRN

## 2013-01-06 MED ORDER — NITROGLYCERIN 0.4 MG SL SUBL
0.4000 mg | SUBLINGUAL_TABLET | SUBLINGUAL | Status: DC | PRN
  Administered 2013-01-06: 0.4 mg via SUBLINGUAL
  Filled 2013-01-06: qty 25

## 2013-01-06 MED ORDER — ASPIRIN EC 325 MG PO TBEC
325.0000 mg | DELAYED_RELEASE_TABLET | Freq: Every day | ORAL | Status: DC
  Administered 2013-01-06: 325 mg via ORAL
  Filled 2013-01-06: qty 1

## 2013-01-06 MED ORDER — ONDANSETRON HCL 4 MG/2ML IJ SOLN
INTRAMUSCULAR | Status: AC
  Administered 2013-01-06: 4 mg via INTRAVENOUS
  Filled 2013-01-06: qty 2

## 2013-01-06 MED ORDER — TRAMADOL HCL 50 MG PO TABS: 50.0000 mg | ORAL_TABLET | Freq: Four times a day (QID) | ORAL | Status: AC | PRN

## 2013-01-06 MED ORDER — CHLORHEXIDINE GLUCONATE CLOTH 2 % EX PADS
6.0000 | MEDICATED_PAD | Freq: Every day | CUTANEOUS | Status: DC
  Administered 2013-01-06: 6 via TOPICAL

## 2013-01-06 MED ORDER — SODIUM CHLORIDE 0.9 % IV SOLN: INTRAVENOUS | Status: DC

## 2013-01-06 MED ORDER — ACETAMINOPHEN 325 MG PO TABS: 650.0000 mg | ORAL_TABLET | Freq: Four times a day (QID) | ORAL | Status: DC | PRN

## 2013-01-06 MED ORDER — ATORVASTATIN CALCIUM 20 MG PO TABS: 20.0000 mg | ORAL_TABLET | Freq: Every day | ORAL | Status: AC

## 2013-01-06 MED ORDER — MORPHINE SULFATE 2 MG/ML IJ SOLN
1.0000 mg | Freq: Once | INTRAMUSCULAR | Status: AC
  Administered 2013-01-06: 1 mg via INTRAVENOUS
  Filled 2013-01-06: qty 1

## 2013-01-06 MED ORDER — ONDANSETRON HCL 4 MG/2ML IJ SOLN: 4.0000 mg | Freq: Four times a day (QID) | INTRAMUSCULAR | Status: DC | PRN

## 2013-01-06 MED ORDER — ONDANSETRON HCL 4 MG/2ML IJ SOLN
4.0000 mg | Freq: Once | INTRAMUSCULAR | Status: AC
  Administered 2013-01-06: 4 mg via INTRAVENOUS

## 2013-01-06 MED ORDER — ACETAMINOPHEN 650 MG RE SUPP: 650.0000 mg | Freq: Four times a day (QID) | RECTAL | Status: DC | PRN

## 2013-01-06 NOTE — Progress Notes (Signed)
[facility] PROGRESS NOTE  ANNE-MARIE GENSON  WUJ:[id number]  DOB: 14-Sep-1965  DOA: 01/05/2013 PCP: Herb Grays, MD  Assessment and Plan: Pt presented with atypica chest pain and still continues t have chest pain. She has 2 negative troponin, ER physician requested to admit the patient as the chest pain is still on and off present. Pt accepted to be admitted for observation in step down unit.  Author: Lynden Oxford, MD [facility] Pager: [phone] 01/06/2013 5:25 AM   If 7PM-7AM, please contact night-coverage at [email or web address], password [facility]

## 2013-01-06 NOTE — Progress Notes (Signed)
Patient ID: Sherri Hull  female  ZOX:[id number]    DOB: [birth year]    DOA: 01/05/2013  PCP: Herb Grays, MD  Assessment/Plan: Principal Problem:   Chest pain: Atypical also component of costochondritis - Cardiac enzymes are negative so far -Continue aspirin, BP control, await 2-D echo - D-dimer within normal limits   Active Problems:   HTN (hypertension): Currently stable, patient was recently started on Benicar    Costochondritis - Continue pain control  DVT Prophylaxis:  Code Status:  Disposition: Awaiting 2-D echo results    Subjective: Chest pain improving,  Objective: Weight change:   Intake/Output Summary (Last 24 hours) at 01/06/13 1322 Last data filed at 01/06/13 0800  Gross per 24 hour  Intake    200 ml  Output    325 ml  Net   -125 ml   Blood pressure 114/49, pulse 68, temperature 98.5 F (36.9 C), temperature source Oral, resp. rate 17, height 5\' 7"  (1.702 m), weight 108.4 kg (238 lb 15.7 oz), SpO2 92.00%.  Physical Exam: General: Alert and awake, oriented x3, not in any acute distress. HEENT: anicteric sclera, PERLA, EOMI CVS: S1-S2 clear, no murmur rubs or gallops Chest: clear to auscultation bilaterally, no wheezing, rales or rhonchi, mild reproducible chest wall tenderness to palpation Abdomen: soft nontender, nondistended, normal bowel sounds  Extremities: no cyanosis, clubbing or edema noted bilaterally Neuro: Cranial nerves II-XII intact, no focal neurological deficits  Lab Results: Basic Metabolic Panel:  Recent Labs Lab 01/05/13 2318 01/06/13 0810  NA 138  --   K 3.4*  --   CL 100  --   CO2 29  --   GLUCOSE 132*  --   BUN 20  --   CREATININE 1.00 0.74  CALCIUM 10.0  --    Liver Function Tests:  Recent Labs Lab 01/05/13 2318  AST 21  ALT 32  ALKPHOS 75  BILITOT 0.2*  PROT 7.9  ALBUMIN 4.5   No results found for this basename: LIPASE, AMYLASE,  in the last 168 hours No results found for this basename: AMMONIA,  in the  last 168 hours CBC:  Recent Labs Lab 01/05/13 2318 01/06/13 0810  WBC 7.4 5.4  NEUTROABS 2.9  --   HGB 15.0 13.5  HCT 44.3 40.6  MCV 89.5 88.8  PLT 210 185   Cardiac Enzymes:  Recent Labs Lab 01/05/13 2318 01/06/13 0132 01/06/13 0616  TROPONINI <0.30 <0.30 <0.30   BNP: No components found with this basename: POCBNP,  CBG: No results found for this basename: GLUCAP,  in the last 168 hours   Micro Results: Recent Results (from the past 240 hour(s))  MRSA PCR SCREENING     Status: Abnormal   Collection Time    01/06/13  5:51 AM      Result Value Range Status   MRSA by PCR POSITIVE (*) NEGATIVE Final   Comment:            The GeneXpert MRSA Assay (FDA     approved for NASAL specimens     only), is one component of a     comprehensive MRSA colonization     surveillance program. It is not     intended to diagnose MRSA     infection nor to guide or     monitor treatment for     MRSA infections.     RESULT CALLED TO, READ BACK BY AND VERIFIED WITH:     T SHELTON,RN 01/06/13 0927 BY K SCHULTZ  Studies/Results: Dg Chest 2 View  01/05/2013   *RADIOLOGY REPORT*  Clinical Data: The patient complains of central chest tightness while laying down  CHEST - 2 VIEW  Comparison: None.  Findings: The heart size and mediastinal contours are within normal limits.  Both lungs are clear.  The visualized skeletal structures are unremarkable.  IMPRESSION: Negative exam.   Original Report Authenticated By: Signa Kell, M.D.    Medications: Scheduled Meds: . aspirin EC  325 mg Oral Daily  . Chlorhexidine Gluconate Cloth  6 each Topical Q0600  . enoxaparin (LOVENOX) injection  40 mg Subcutaneous Q24H  . irbesartan  300 mg Oral Daily  . mupirocin ointment  1 application Nasal BID  . sodium chloride  3 mL Intravenous Q12H      LOS: 1 day   Asuna Peth M.D. [facility] 01/06/2013, 1:22 PM Pager: [phone]  If 7PM-7AM, please contact  night-coverage [email or web address] Password [email or web address]

## 2013-01-06 NOTE — ED Provider Notes (Signed)
CSN: [id number]     Arrival date & time 01/05/13  2302 History   First MD Initiated Contact with Patient 01/06/13 0013     Chief Complaint  Patient presents with  . Chest Pain   (Consider location/radiation/quality/duration/timing/severity/associated sxs/prior Treatment) The history is provided by the patient.   47 year old female primary care Dr. Herb Grays. Patient with onset of chest pain today at 2:00 in the afternoon it lasted for an hour until 3:00 in the afternoon pain described as pressure and tightness. Associated with some dizziness some shortness of breath some nausea no diaphoresis. This reoccurred tonight at around 9:00 in the evening and lasted for 45 minutes. In between time she had intermittent pain that would calm and lasts for 10-15 minutes. Pain is described as a 10 its worse. Never anything like this before. Patient's mother is a family history of heart attack prior to age 32. Pain radiates to the back. Not made better or worse by anything. Patient did not take any aspirin at home.  Past Medical History  Diagnosis Date  . PONV (postoperative nausea and vomiting)   . No pertinent past medical history   . Nausea     x 2mos  . Kidney stone   . Hypertension    Past Surgical History  Procedure Laterality Date  . Abdominal hysterectomy  2009  . Cholecystectomy  08/20/2011    Procedure: LAPAROSCOPIC CHOLECYSTECTOMY WITH INTRAOPERATIVE CHOLANGIOGRAM;  Surgeon: Lodema Pilot, DO;  Location: [facility] OR;  Service: General;  Laterality: N/A;   No family history on file. History  Substance Use Topics  . Smoking status: Never Smoker   . Smokeless tobacco: Not on file  . Alcohol Use: No   OB History   Grav Para Term Preterm Abortions TAB SAB Ect Mult Living                 Review of Systems  Constitutional: Negative for fever.  HENT: Negative for congestion.   Eyes: Negative for visual disturbance.  Respiratory: Positive for chest tightness and shortness of breath.    Cardiovascular: Positive for chest pain. Negative for leg swelling.  Gastrointestinal: Positive for nausea. Negative for vomiting.  Genitourinary: Negative for dysuria.  Musculoskeletal: Positive for back pain.  Skin: Negative for rash.  Neurological: Negative for headaches.  Hematological: Does not bruise/bleed easily.  Psychiatric/Behavioral: Negative for confusion.    Allergies  Review of patient's allergies indicates no known allergies.  Home Medications   Current Outpatient Rx  Name  Route  Sig  Dispense  Refill  . olmesartan (BENICAR) 40 MG tablet   Oral   Take 40 mg by mouth daily.         Marland Kitchen HYDROcodone-acetaminophen (NORCO/VICODIN) 5-325 MG per tablet   Oral   Take 1 tablet by mouth every 6 (six) hours as needed for pain.   6 tablet   0   . ondansetron (ZOFRAN) 4 MG tablet   Oral   Take 1 tablet (4 mg total) by mouth every 6 (six) hours.   12 tablet   0    BP 129/61  Pulse 90  Temp(Src) 98 F (36.7 C) (Oral)  Resp 15  Ht 5\' 7"  (1.702 m)  Wt 210 lb (95.255 kg)  BMI 32.88 kg/m2  SpO2 100% Physical Exam  Nursing note and vitals reviewed. Constitutional: She is oriented to person, place, and time. She appears well-developed and well-nourished. No distress.  HENT:  Head: Normocephalic and atraumatic.  Mouth/Throat: Oropharynx is clear and  moist.  Eyes: Conjunctivae and EOM are normal. Pupils are equal, round, and reactive to light.  Neck: Normal range of motion.  Cardiovascular: Normal rate, regular rhythm and normal heart sounds.   No murmur heard. Pulmonary/Chest: Effort normal and breath sounds normal. No respiratory distress. She has no rales. She exhibits tenderness.  Abdominal: Soft. Bowel sounds are normal. There is no tenderness.  Musculoskeletal: Normal range of motion. She exhibits no edema.  Neurological: She is alert and oriented to person, place, and time. No cranial nerve deficit. She exhibits normal muscle tone. Coordination normal.   Skin: Skin is warm. No rash noted.    ED Course  Procedures (including critical care time) Labs Review Labs Reviewed  CBC WITH DIFFERENTIAL - Abnormal; Notable for the following:    Neutrophils Relative % 39 (*)    Lymphocytes Relative 50 (*)    All other components within normal limits  COMPREHENSIVE METABOLIC PANEL - Abnormal; Notable for the following:    Potassium 3.4 (*)    Glucose, Bld 132 (*)    Total Bilirubin 0.2 (*)    GFR calc non Af Amer 67 (*)    GFR calc Af Amer 78 (*)    All other components within normal limits  TROPONIN I  D-DIMER, QUANTITATIVE  TROPONIN I   Imaging Review Dg Chest 2 View  01/05/2013   *RADIOLOGY REPORT*  Clinical Data: The patient complains of central chest tightness while laying down  CHEST - 2 VIEW  Comparison: None.  Findings: The heart size and mediastinal contours are within normal limits.  Both lungs are clear.  The visualized skeletal structures are unremarkable.  IMPRESSION: Negative exam.   Original Report Authenticated By: Signa Kell, M.D.    Date: 01/06/2013  Rate: 67  Rhythm: normal sinus rhythm  QRS Axis: normal  Intervals: normal  ST/T Wave abnormalities: normal  Conduction Disutrbances:none  Narrative Interpretation:   Old EKG Reviewed: unchanged No change in EKG compared to 06/01/1997  Results for orders placed during the hospital encounter of 01/05/13  CBC WITH DIFFERENTIAL      Result Value Range   WBC 7.4  4.0 - 10.5 K/uL   RBC 4.95  3.87 - 5.11 MIL/uL   Hemoglobin 15.0  12.0 - 15.0 g/dL   HCT 56.2  13.0 - 86.5 %   MCV 89.5  78.0 - 100.0 fL   MCH 30.3  26.0 - 34.0 pg   MCHC 33.9  30.0 - 36.0 g/dL   RDW 78.4  69.6 - 29.5 %   Platelets 210  150 - 400 K/uL   Neutrophils Relative % 39 (*) 43 - 77 %   Neutro Abs 2.9  1.7 - 7.7 K/uL   Lymphocytes Relative 50 (*) 12 - 46 %   Lymphs Abs 3.7  0.7 - 4.0 K/uL   Monocytes Relative 9  3 - 12 %   Monocytes Absolute 0.7  0.1 - 1.0 K/uL   Eosinophils Relative 2  0 - 5 %    Eosinophils Absolute 0.1  0.0 - 0.7 K/uL   Basophils Relative 0  0 - 1 %   Basophils Absolute 0.0  0.0 - 0.1 K/uL  COMPREHENSIVE METABOLIC PANEL      Result Value Range   Sodium 138  135 - 145 mEq/L   Potassium 3.4 (*) 3.5 - 5.1 mEq/L   Chloride 100  96 - 112 mEq/L   CO2 29  19 - 32 mEq/L   Glucose, Bld 132 (*) 70 -  99 mg/dL   BUN 20  6 - 23 mg/dL   Creatinine, Ser 4.09  0.50 - 1.10 mg/dL   Calcium 81.1  8.4 - 91.4 mg/dL   Total Protein 7.9  6.0 - 8.3 g/dL   Albumin 4.5  3.5 - 5.2 g/dL   AST 21  0 - 37 U/L   ALT 32  0 - 35 U/L   Alkaline Phosphatase 75  39 - 117 U/L   Total Bilirubin 0.2 (*) 0.3 - 1.2 mg/dL   GFR calc non Af Amer 67 (*) >90 mL/min   GFR calc Af Amer 78 (*) >90 mL/min  TROPONIN I      Result Value Range   Troponin I <0.30  <0.30 ng/mL  D-DIMER, QUANTITATIVE      Result Value Range   D-Dimer, Quant 0.28  0.00 - 0.48 ug/mL-FEU  TROPONIN I      Result Value Range   Troponin I <0.30  <0.30 ng/mL     MDM   1. Chest pain    Patient with cystoscopy chest pain new onset cardiac risk factor with mother having cardiac disease before age 8. Unable to get the pain completely gone despite morphine aspirin and nitroglycerin. Patient will require admission for rule out hospitalist contacted for admission. Patient troponins negative chest x-ray negative for pneumonia pneumothorax. EKG has no acute changes. Electrolytes without any sniffing abnormalities other than a mild hypokalemia with a potassium of 3.4. No anemia no leukocytosis. The liver function test abnormalities. D-dimer is also negative so not consistent with pulmonary embolism.    Shelda Jakes, MD 01/06/13 [phone]

## 2013-01-06 NOTE — Progress Notes (Signed)
Notified by Lab re: (+) MRSA PCR.  Patient was placed on Contact Isolation per result & protocol.  Educated patient and MRSA information sheet given for patient to read.  Contact Isolation hanger on outside of door.  MRSA order set being initiated.

## 2013-01-06 NOTE — H&P (Signed)
[facility] History and Physical  Sherri Hull ZOX:[id number] DOB: 1966-04-01 DOA: 01/05/2013  Referring physician: Patient was transferred from [facility]. PCP: Herb Grays, MD   Chief Complaint: Chest pain.  HPI: Sherri Hull is a 47 y.o. female who was recently diagnosed with hypertension last week and was placed on Benicar presents with chest pain. Patient states that since yesterday morning patient has been experiencing retrosternal chest pain radiating to the back. There was no associated shortness of breath nausea vomiting diaphoresis fever chills or any productive cough. Patient's chest pain is not exertional. In the ER EKG cardiac markers d-dimer and chest x-ray were unremarkable. Patient has been admitted for further management.  Review of Systems: As presented in the history of presenting illness, rest negative.  Past Medical History  Diagnosis Date  . PONV (postoperative nausea and vomiting)   . No pertinent past medical history   . Nausea     x 2mos  . Kidney stone   . Hypertension    Past Surgical History  Procedure Laterality Date  . Abdominal hysterectomy  2009  . Cholecystectomy  08/20/2011    Procedure: LAPAROSCOPIC CHOLECYSTECTOMY WITH INTRAOPERATIVE CHOLANGIOGRAM;  Surgeon: Lodema Pilot, DO;  Location: [facility] OR;  Service: General;  Laterality: N/A;   Social History:  reports that she has never smoked. She does not have any smokeless tobacco history on file. She reports that she does not drink alcohol or use illicit drugs. Home. where does patient live-- yes Can patient participate in ADLs?  No Known Allergies  Family History  Problem Relation Age of Onset  . Pulmonary embolism Mother       Prior to Admission medications   Medication Sig Start Date End Date Taking? Authorizing Provider  olmesartan (BENICAR) 40 MG tablet Take 40 mg by mouth daily.   Yes Historical Provider, MD  HYDROcodone-acetaminophen (NORCO/VICODIN) 5-325 MG per tablet  Take 1 tablet by mouth every 6 (six) hours as needed for pain. 12/07/12   Gwyneth Sprout, MD  ondansetron (ZOFRAN) 4 MG tablet Take 1 tablet (4 mg total) by mouth every 6 (six) hours. 12/07/12   Gwyneth Sprout, MD   Physical Exam: Filed Vitals:   01/06/13 8119 01/06/13 0517 01/06/13 0535 01/06/13 0600  BP: 115/68 134/76 134/76 115/67  Pulse: 78  72 70  Temp:  98.8 F (37.1 C) 98.8 F (37.1 C)   TempSrc:  Oral Oral   Resp: 18 16  15   Height:   5\' 7"  (1.702 m)   Weight:   108.4 kg (238 lb 15.7 oz)   SpO2: 99% 98% 99% 100%     General:  Well-developed well-nourished.  Eyes: Anicteric no pallor.  ENT: No discharge from the ears eyes nose mouth.  Neck: No mass felt.  Cardiovascular: S1-S2 heard.  Respiratory: No rhonchi or crepitations.  Abdomen: Soft nontender bowel sounds present.  Skin: No rash.  Musculoskeletal: No edema.  Psychiatric: Appears normal.  Neurologic: Alert awake oriented to time place and person. Moves all extremities.  Labs on Admission:  Basic Metabolic Panel:  Recent Labs Lab 01/05/13 2318  NA 138  K 3.4*  CL 100  CO2 29  GLUCOSE 132*  BUN 20  CREATININE 1.00  CALCIUM 10.0   Liver Function Tests:  Recent Labs Lab 01/05/13 2318  AST 21  ALT 32  ALKPHOS 75  BILITOT 0.2*  PROT 7.9  ALBUMIN 4.5   No results found for this basename: LIPASE, AMYLASE,  in the last 168  hours No results found for this basename: AMMONIA,  in the last 168 hours CBC:  Recent Labs Lab 01/05/13 2318  WBC 7.4  NEUTROABS 2.9  HGB 15.0  HCT 44.3  MCV 89.5  PLT 210   Cardiac Enzymes:  Recent Labs Lab 01/05/13 2318 01/06/13 0132  TROPONINI <0.30 <0.30    BNP (last 3 results) No results found for this basename: PROBNP,  in the last 8760 hours CBG: No results found for this basename: GLUCAP,  in the last 168 hours  Radiological Exams on Admission: Dg Chest 2 View  01/05/2013   *RADIOLOGY REPORT*  Clinical Data: The patient complains of  central chest tightness while laying down  CHEST - 2 VIEW  Comparison: None.  Findings: The heart size and mediastinal contours are within normal limits.  Both lungs are clear.  The visualized skeletal structures are unremarkable.  IMPRESSION: Negative exam.   Original Report Authenticated By: Signa Kell, M.D.    EKG: Independently reviewed. Normal sinus rhythm.  Assessment/Plan Principal Problem:   Chest pain Active Problems:   HTN (hypertension)   1. Chest pain - cycle cardiac markers. 2-D echo. When necessary morphine. Nitroglycerin when necessary. 2. Hypertension - continue Benicar.    Code Status: Full code.  Family Communication: None.  Disposition Plan: Admit for observation.    Huber Mathers N. [facility] Pager [phone].  If 7PM-7AM, please contact night-coverage [email or web address] Password [facility] 01/06/2013, 6:17 AM

## 2013-01-06 NOTE — Progress Notes (Signed)
Patient d/c'd to home with husband.  Ambulated in room without chestpain.  PIV d/c'd w/pressure dressing applied to site.  D/C instructions and prescriptions given w/all questions and concerns addressed.  Patient awake, alert and oriented at time of discharge.

## 2013-01-06 NOTE — Discharge Summary (Signed)
Physician Discharge Summary  Patient ID: Sherri Hull MRN: [id number] DOB/AGE: 46-Oct-1967 46 y.o.  Admit date: 01/05/2013 Discharge date: 01/06/2013  Primary Care Physician:  Herb Grays, MD  Discharge Diagnoses:    . Chest pain . HTN (hypertension) . Costochondritis . Other and unspecified hyperlipidemia  Consults: none   Recommendations for Outpatient Follow-up:  1. please follow her BP, may need further adjustments in her medications 2. Lipid panel showed LDL of 147, triglycerides 409, cholesterol 229, patient is also started on Lipitor. Please follow LFTs.  Allergies:  No Known Allergies   Discharge Medications:   Medication List         aspirin EC 81 MG tablet  Take 1 tablet (81 mg total) by mouth daily.     atorvastatin 20 MG tablet  Commonly known as:  LIPITOR  Take 1 tablet (20 mg total) by mouth daily.     nitroGLYCERIN 0.4 MG SL tablet  Commonly known as:  NITROSTAT  Place 1 tablet (0.4 mg total) under the tongue every 5 (five) minutes x 3 doses as needed for chest pain.     olmesartan 40 MG tablet  Commonly known as:  BENICAR  Take 40 mg by mouth daily.     traMADol 50 MG tablet  Commonly known as:  ULTRAM  Take 1 tablet (50 mg total) by mouth every 6 (six) hours as needed for pain.         Brief H and P: For complete details please refer to admission H and P, but in brief patient is a 47 year old female who was recently diagnosed with hypertension last week and was placed on Benicar presented to the ED for chest pain. Patient reported that she was experiencing chest pain radiating to the back but no shortness of breath nausea, vomiting or diaphoresis. Chest pain was not exertional. EKG, cardiac markers, d-dimer and chest x-ray in the ED were unremarkable.  Hospital Course:    Chest pain: Atypical also component of costochondritis this had improved, although she has risk factors of hypertension, newly diagnosed and activity. The patient was  admitted to SDU , cardiac enzymes were negative. Patient was continued on aspirin, BP controlled. Lipid panel was also checked which showed LDL of 147, triglycerides 811, cholesterol 229, patient is also started on Lipitor.   D-dimer was within normal limits.  2-D echo showed EF of 55-60%, grade 1 diastolic dysfunction  HTN (hypertension): Currently stable, patient was recently started on Benicar. Currently BP is stable.    Costochondritis - Continue pain control  Day of Discharge BP 128/74  Pulse 68  Temp(Src) 98.3 F (36.8 C) (Oral)  Resp 20  Ht 5\' 7"  (1.702 m)  Wt 108.4 kg (238 lb 15.7 oz)  BMI 37.42 kg/m2  SpO2 96%  Physical Exam: General: Alert and awake oriented x3 not in any acute distress. CVS: S1-S2 clear no murmur rubs or gallops Chest: clear to auscultation bilaterally, no wheezing rales or rhonchi, reproducible mild chest wall tenderness to deep palpation Abdomen: soft nontender, nondistended, normal bowel sounds, no organomegaly Extremities: no cyanosis, clubbing or edema noted bilaterally Neuro: Cranial nerves II-XII intact, no focal neurological deficits   The results of significant diagnostics from this hospitalization (including imaging, microbiology, ancillary and laboratory) are listed below for reference.    LAB RESULTS: Basic Metabolic Panel:  Recent Labs Lab 01/05/13 2318 01/06/13 0810  NA 138  --   K 3.4*  --   CL 100  --   CO2 29  --  GLUCOSE 132*  --   BUN 20  --   CREATININE 1.00 0.74  CALCIUM 10.0  --    Liver Function Tests:  Recent Labs Lab 01/05/13 2318  AST 21  ALT 32  ALKPHOS 75  BILITOT 0.2*  PROT 7.9  ALBUMIN 4.5   No results found for this basename: LIPASE, AMYLASE,  in the last 168 hours No results found for this basename: AMMONIA,  in the last 168 hours CBC:  Recent Labs Lab 01/05/13 2318 01/06/13 0810  WBC 7.4 5.4  NEUTROABS 2.9  --   HGB 15.0 13.5  HCT 44.3 40.6  MCV 89.5 88.8  PLT 210 185   Cardiac  Enzymes:  Recent Labs Lab 01/06/13 0616 01/06/13 1423  TROPONINI <0.30 <0.30   BNP: No components found with this basename: POCBNP,  CBG: No results found for this basename: GLUCAP,  in the last 168 hours  Significant Diagnostic Studies:  Dg Chest 2 View  01/05/2013   *RADIOLOGY REPORT*  Clinical Data: The patient complains of central chest tightness while laying down  CHEST - 2 VIEW  Comparison: None.  Findings: The heart size and mediastinal contours are within normal limits.  Both lungs are clear.  The visualized skeletal structures are unremarkable.  IMPRESSION: Negative exam.   Original Report Authenticated By: Signa Kell, M.D.    2D ECHO: Study Conclusions  - Left ventricle: The cavity size was normal. Wall thickness was normal. Systolic function was normal. The estimated ejection fraction was in the range of 55% to 60%. Doppler parameters are consistent with abnormal left ventricular relaxation (grade 1 diastolic dysfunction). - Mitral valve: Calcified annulus. Mildly thickened leaflets . - Pericardium, extracardiac: A trivial pericardial effusion was identified.    Disposition and Follow-up: Discharge Orders   Future Orders Complete By Expires   Diet - low sodium heart healthy  As directed    Increase activity slowly  As directed        DISPOSITION: Home  DIET: Heart healthy diet  ACTIVITY: As tolerated    DISCHARGE FOLLOW-UP Follow-up Information   Follow up with Herb Grays, MD. Schedule an appointment as soon as possible for a visit in 1 week.   Specialty:  Family Medicine   Contact information:   [address] [place] [place] [address] [phone]       Time spent on Discharge: 35 minutes  Signed:   Ravis Herne M.D. [facility] 01/06/2013, 5:31 PM Pager: [phone]

## 2013-01-12 MED FILL — Hydromorphone HCl Preservative Free (PF) Inj 2 MG/ML: INTRAMUSCULAR | Qty: 1 | Status: AC

## 2013-09-14 IMAGING — NM NM HEPATO W/GB/PHARM/[PERSON_NAME]
3 series · 13 of 13 positions shown · non-contrast
Comparison: Ultrasound 07/09/2011

CLINICAL DATA: History of right upper quadrant abdominal pain.

NUCLEAR MEDICINE HEPATOBILIARY IMAGING WITH GALLBLADDER EF
TECHNIQUE: Sequential images of the abdomen were obtained [DATE]
minutes following intravenous administration of
radiopharmaceutical. After slow intravenous infusion of 1.7 uCg
Cholecystokinin, gallbladder ejection fraction was determined.
Radiopharmaceutical: 5.0 mCi Rc-66m Choletec

[he hepatobiliary · 3.43mm/px · 6 of 30 frames shown (1 of 3)]
[frame 3/30]
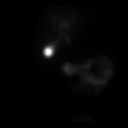
[frame 8/30]
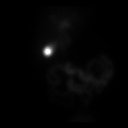
[frame 13/30]
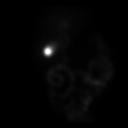
[frame 18/30]
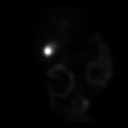
[frame 23/30]
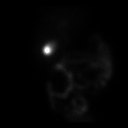
[frame 28/30]
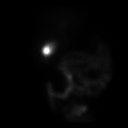

[he hepatobiliary · 3.43mm/px · 6 of 46 frames shown (2 of 3)]
[frame 4/46]
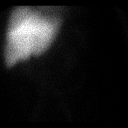
[frame 12/46]
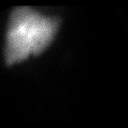
[frame 20/46]
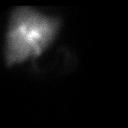
[frame 27/46]
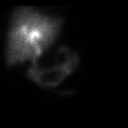
[frame 35/46]
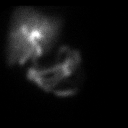
[frame 43/46]
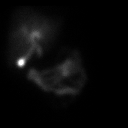

[he hepatobiliary · 1 of 1 slices shown (3 of 3)]
[im 1/1]
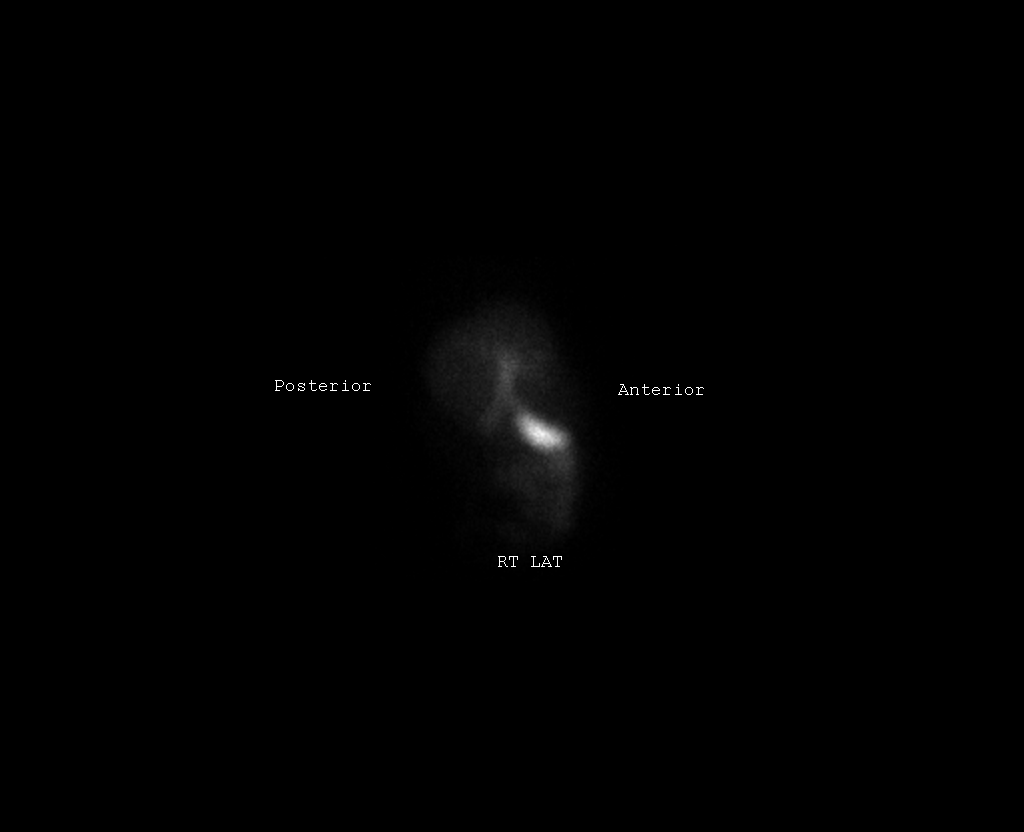

[13 of 13 positions shown; findings below may reference images not displayed]

FINDINGS: There is prompt visualization of hepatic activity. There
is prompt visualization of the common bile duct. Subsequently
intestinal activity was identified.

The gallbladder began to visualize at 36 minutes.

During CCK infusion the gallbladder contracted 12.1%.  This is
abnormally low.. 30% or greater is normal range.

During CCK infusion the patient reported  experiencing no symptoms.
IMPRESSION: There is demonstration of patency of the common bile duct and the
cystic duct. There is no evidence of acute cholecystitis. There is
slight delayed visualization of the gallbladder which may be
associated with element of chronic cholecystitis.

During CCK infusion the gallbladder contracted 12.1%.  This is
abnormally low.. 30% or greater is normal range.

During CCK infusion the patient reported  experiencing no symptoms.

## 2014-08-10 ENCOUNTER — Other Ambulatory Visit: Payer: Self-pay | Admitting: Internal Medicine

## 2014-08-10 DIAGNOSIS — Z1231 Encounter for screening mammogram for malignant neoplasm of breast: Secondary | ICD-10-CM

## 2014-08-24 ENCOUNTER — Ambulatory Visit: Payer: Self-pay

## 2019-04-03 ENCOUNTER — Other Ambulatory Visit: Payer: Self-pay

## 2019-04-03 DIAGNOSIS — Z20822 Contact with and (suspected) exposure to covid-19: Secondary | ICD-10-CM

## 2019-04-06 LAB — NOVEL CORONAVIRUS, NAA: SARS-CoV-2, NAA: DETECTED — AB

## 2019-04-08 ENCOUNTER — Telehealth: Payer: Self-pay | Admitting: Unknown Physician Specialty

## 2019-04-08 NOTE — Telephone Encounter (Signed)
Called to discuss with patient about Covid symptoms and the use of bamlanivimab, a monoclonal antibody infusion for those with mild to moderate Covid symptoms and at a high risk of hospitalization.  Pt is qualified for this infusion at the [facility] due to BMI>35   Left message to call back  

## 2023-01-31 ENCOUNTER — Emergency Department (HOSPITAL_BASED_OUTPATIENT_CLINIC_OR_DEPARTMENT_OTHER)
Admission: EM | Admit: 2023-01-31 | Discharge: 2023-01-31 | Disposition: A | Discharge status: Home / Self Care | Payer: Self-pay | Attending: Emergency Medicine | Admitting: Emergency Medicine

## 2023-01-31 ENCOUNTER — Emergency Department (HOSPITAL_BASED_OUTPATIENT_CLINIC_OR_DEPARTMENT_OTHER): Payer: Self-pay

## 2023-01-31 DIAGNOSIS — I10 Essential (primary) hypertension: Secondary | ICD-10-CM | POA: Insufficient documentation

## 2023-01-31 DIAGNOSIS — K5732 Diverticulitis of large intestine without perforation or abscess without bleeding: Secondary | ICD-10-CM | POA: Insufficient documentation

## 2023-01-31 DIAGNOSIS — Z7982 Long term (current) use of aspirin: Secondary | ICD-10-CM | POA: Insufficient documentation

## 2023-01-31 DIAGNOSIS — Z79899 Other long term (current) drug therapy: Secondary | ICD-10-CM | POA: Insufficient documentation

## 2023-01-31 DIAGNOSIS — Z87442 Personal history of urinary calculi: Secondary | ICD-10-CM | POA: Insufficient documentation

## 2023-01-31 LAB — COMPREHENSIVE METABOLIC PANEL
ALT: 22 U/L (ref 0–44)
AST: 17 U/L (ref 15–41)
Albumin: 4.2 g/dL (ref 3.5–5.0)
Alkaline Phosphatase: 72 U/L (ref 38–126)
Anion gap: 7 (ref 5–15)
BUN: 12 mg/dL (ref 6–20)
CO2: 28 mmol/L (ref 22–32)
Calcium: 9.1 mg/dL (ref 8.9–10.3)
Chloride: 104 mmol/L (ref 98–111)
Creatinine, Ser: 0.8 mg/dL (ref 0.44–1.00)
GFR, Estimated: >60 mL/min (ref >60)
Glucose, Bld: 117 mg/dL — ABNORMAL HIGH (ref 70–99)
Potassium: 3.7 mmol/L (ref 3.5–5.1)
Sodium: 139 mmol/L (ref 135–145)
Total Bilirubin: 0.5 mg/dL (ref 0.3–1.2)
Total Protein: 7 g/dL (ref 6.5–8.1)

## 2023-01-31 LAB — CBC
HCT: 40.8 % (ref 36.0–46.0)
Hemoglobin: 13.7 g/dL (ref 12.0–15.0)
MCH: 29.8 pg (ref 26.0–34.0)
MCHC: 33.6 g/dL (ref 30.0–36.0)
MCV: 88.9 fL (ref 80.0–100.0)
Platelets: 173 10*3/uL (ref 150–400)
RBC: 4.59 MIL/uL (ref 3.87–5.11)
RDW: 12.3 % (ref 11.5–15.5)
WBC: 5.5 10*3/uL (ref 4.0–10.5)
nRBC: 0 % (ref 0.0–0.2)

## 2023-01-31 LAB — URINALYSIS, ROUTINE W REFLEX MICROSCOPIC
Bilirubin Urine: NEGATIVE
Glucose, UA: NEGATIVE mg/dL
Ketones, ur: NEGATIVE mg/dL
Nitrite: NEGATIVE
Protein, ur: 30 mg/dL — AB
Specific Gravity, Urine: 1.023 (ref 1.005–1.030)
pH: 5 (ref 5.0–8.0)

## 2023-01-31 LAB — LIPASE, BLOOD: Lipase: 15 U/L (ref 11–51)

## 2023-01-31 MED ORDER — IOHEXOL 300 MG/ML  SOLN
100.0000 mL | Freq: Once | INTRAMUSCULAR | Status: AC | PRN
  Administered 2023-01-31: 100 mL via INTRAVENOUS

## 2023-01-31 MED ORDER — AMOXICILLIN-POT CLAVULANATE 875-125 MG PO TABS
1.0000 | ORAL_TABLET | Freq: Two times a day (BID) | ORAL | 0 refills | Status: AC
Start: 2023-01-31 — End: ?

## 2023-01-31 NOTE — Discharge Instructions (Addendum)
As we discussed, your workup here today revealed that you have diverticulitis.  I have attached some further information regarding this diagnosis to this paperwork.  The treatment of this is Augmentin which I have given you a prescription for.  Please fill and take this as prescribed in its entirety.  I recommend that you call your primary doctor and schedule an appointment for follow-up.  You should also reach out to your GI doctor to schedule a screening colonoscopy.  Return if development of any new or worsening symptoms.

## 2023-01-31 NOTE — ED Triage Notes (Signed)
Pt reports LLQ pain since Thursday, worse with palpation or positional pressure to the area.  States nausea taht started yesterday, worse today.

## 2023-01-31 NOTE — ED Provider Notes (Signed)
[place] EMERGENCY DEPARTMENT AT [facility] Provider Note   CSN: [id number] Arrival date & time: 01/31/23  4098     History  Chief Complaint  Patient presents with   Abdominal Pain    Sherri Hull is a 57 y.o. female.  Patient with history of hypertension, hyperlipidemia, hysterectomy, cholecystectomy, and kidney stone presents today with complaints of abdominal pain.  She states that same began throughout the day on Thursday (3 days ago) and has been persistent since then.  She notes associated nausea without vomiting or diarrhea.  Denies any history of similar symptoms previously.  Pain is located in her left lower quadrant and has been in the same place throughout the duration of her symptoms.  She denies any hematochezia or melena.  She is having regular bowel movements.  Denies any urinary symptoms.  No fevers or chills.  The history is provided by the patient. No language interpreter was used.  Abdominal Pain Associated symptoms: nausea        Home Medications Prior to Admission medications   Medication Sig Start Date End Date Taking? Authorizing Provider  aspirin EC 81 MG tablet Take 1 tablet (81 mg total) by mouth daily. 01/06/13   Rai, Delene Ruffini, MD  atorvastatin (LIPITOR) 20 MG tablet Take 1 tablet (20 mg total) by mouth daily. 01/06/13   Rai, Delene Ruffini, MD  nitroGLYCERIN (NITROSTAT) 0.4 MG SL tablet Place 1 tablet (0.4 mg total) under the tongue every 5 (five) minutes x 3 doses as needed for chest pain. 01/06/13   Rai, Delene Ruffini, MD  olmesartan (BENICAR) 40 MG tablet Take 40 mg by mouth daily.    [provider]  traMADol (ULTRAM) 50 MG tablet Take 1 tablet (50 mg total) by mouth every 6 (six) hours as needed for pain. 01/06/13   Cathren Harsh, MD      Allergies    Patient has no known allergies.    Review of Systems   Review of Systems  Gastrointestinal:  Positive for abdominal pain and nausea.  All other systems reviewed and are  negative.   Physical Exam Updated Vital Signs BP (!) 166/91 (BP Location: Right Arm)   Pulse 80   Temp 98.2 F (36.8 C) (Oral)   Resp 16   SpO2 99%  Physical Exam Vitals and nursing note reviewed.  Constitutional:      General: She is not in acute distress.    Appearance: Normal appearance. She is normal weight. She is not ill-appearing, toxic-appearing or diaphoretic.  HENT:     Head: Normocephalic and atraumatic.  Cardiovascular:     Rate and Rhythm: Normal rate.  Pulmonary:     Effort: Pulmonary effort is normal. No respiratory distress.  Abdominal:     General: Abdomen is flat.     Palpations: Abdomen is soft.     Tenderness: There is abdominal tenderness in the left lower quadrant.  Musculoskeletal:        General: Normal range of motion.     Cervical back: Normal range of motion.  Skin:    General: Skin is warm and dry.  Neurological:     General: No focal deficit present.     Mental Status: She is alert.  Psychiatric:        Mood and Affect: Mood normal.        Behavior: Behavior normal.     ED Results / Procedures / Treatments   Labs (all labs ordered are listed, but only  abnormal results are displayed) Labs Reviewed  LIPASE, BLOOD  COMPREHENSIVE METABOLIC PANEL  CBC  URINALYSIS, ROUTINE W REFLEX MICROSCOPIC    EKG None  Radiology No results found.  Procedures Procedures    Medications Ordered in ED Medications - No data to display  ED Course/ Medical Decision Making/ A&P                                 Medical Decision Making Amount and/or Complexity of Data Reviewed Labs: ordered. Radiology: ordered.   This patient is a 57 y.o. female who presents to the ED for concern of abdominal pain, this involves an extensive number of treatment options, and is a complaint that carries with it a high risk of complications and morbidity. The emergent differential diagnosis prior to evaluation includes, but is not limited to, AAA, gastroenteritis,  appendicitis, Bowel obstruction, Bowel perforation. Gastroparesis, DKA, Hernia, Inflammatory bowel disease, mesenteric ischemia, pancreatitis, diverticulitis, peritonitis SBP, volvulus.  . This is not an exhaustive differential.   Past Medical History / Co-morbidities / Social History:  has a past medical history of Hypertension, Kidney stone, Nausea, No pertinent past medical history, and PONV (postoperative nausea and vomiting).  Additional history: Chart reviewed.  Physical Exam: Physical exam performed. The pertinent findings include: LLQ TTP  Lab Tests: I ordered, and personally interpreted labs.  The pertinent results include: No leukocytosis, UA noninfectious   Imaging Studies: I ordered imaging studies including Ct abdomen pelvis. I independently visualized and interpreted imaging which showed   Changes of proximal sigmoid colon diverticulitis. No complicating features. Recommend follow up after treatment to exclude secondary pathology.   Fatty liver infiltration. Cystic area in the gallbladder fossa which clips suggestive of cholecystectomy. Please correlate for an ectatic cystic duct remnant.  I agree with the radiologist interpretation.  Disposition: After consideration of the diagnostic results and the patients response to treatment, I feel that emergency department workup does not suggest an emergent condition requiring admission or immediate intervention beyond what has been performed at this time. The plan is: Discharge with Augmentin diverticulitis, recommendations for close PCP follow-up and return precautions.  Diverticulitis diet recommended and discussed.  Offered pain medication which she declined.  Discussed incidental findings on her CT scan as well, low suspicion for ectatic cystic duct remnant relating to her symptoms today given they are isolated to the left lower quadrant. Evaluation and diagnostic testing in the emergency department does not suggest an  emergent condition requiring admission or immediate intervention beyond what has been performed at this time.  Plan for discharge with close PCP follow-up.  Patient is understanding and amenable with plan, educated on red flag symptoms that would prompt immediate return.  Patient discharged in stable condition.  Final Clinical Impression(s) / ED Diagnoses Final diagnoses:  Diverticulitis large intestine w/o perforation or abscess w/o bleeding    Rx / DC Orders ED Discharge Orders          Ordered    amoxicillin-clavulanate (AUGMENTIN) 875-125 MG tablet  Every 12 hours        01/31/23 1240          An After Visit Summary was printed and given to the patient.     Vear Clock 01/31/23 1242    Derwood Kaplan, MD 01/31/23 [id number]
